# Patient Record
Sex: Female | Born: 1990 | Race: White | Hispanic: No | Marital: Single | Smoking: Never smoker
Health system: Southern US, Community
[De-identification: ages and names within clinical notes are randomized; demographics above are authoritative.]

## PROBLEM LIST (undated history)

## (undated) DIAGNOSIS — N6009 Solitary cyst of unspecified breast: Secondary | ICD-10-CM

## (undated) DIAGNOSIS — R102 Pelvic and perineal pain: Secondary | ICD-10-CM

## (undated) DIAGNOSIS — N920 Excessive and frequent menstruation with regular cycle: Secondary | ICD-10-CM

## (undated) DIAGNOSIS — N946 Dysmenorrhea, unspecified: Secondary | ICD-10-CM

## (undated) DIAGNOSIS — F419 Anxiety disorder, unspecified: Secondary | ICD-10-CM

## (undated) HISTORY — PX: NO PAST SURGERIES: SHX2092

## (undated) HISTORY — DX: Pelvic and perineal pain: R10.2

## (undated) HISTORY — DX: Excessive and frequent menstruation with regular cycle: N92.0

## (undated) HISTORY — DX: Dysmenorrhea, unspecified: N94.6

## (undated) HISTORY — DX: Solitary cyst of unspecified breast: N60.09

## (undated) HISTORY — DX: Anxiety disorder, unspecified: F41.9

---

## 2014-05-30 ENCOUNTER — Ambulatory Visit: Payer: Self-pay | Admitting: Obstetrics and Gynecology

## 2015-10-04 LAB — OB RESULTS CONSOLE GC/CHLAMYDIA: CHLAMYDIA, DNA PROBE: NEGATIVE

## 2015-11-02 ENCOUNTER — Ambulatory Visit (INDEPENDENT_AMBULATORY_CARE_PROVIDER_SITE_OTHER): Payer: BC Managed Care – PPO | Admitting: Certified Nurse Midwife

## 2015-11-02 ENCOUNTER — Encounter: Payer: Self-pay | Admitting: Certified Nurse Midwife

## 2015-11-02 VITALS — BP 132/94 | HR 71 | Ht 65.0 in | Wt 179.0 lb

## 2015-11-02 DIAGNOSIS — R102 Pelvic and perineal pain: Secondary | ICD-10-CM

## 2015-11-02 DIAGNOSIS — R109 Unspecified abdominal pain: Secondary | ICD-10-CM

## 2015-11-02 DIAGNOSIS — Z202 Contact with and (suspected) exposure to infections with a predominantly sexual mode of transmission: Secondary | ICD-10-CM

## 2015-11-02 LAB — POCT URINALYSIS DIPSTICK
Bilirubin, UA: NEGATIVE
Blood, UA: NEGATIVE
Glucose, UA: NEGATIVE
KETONES UA: NEGATIVE
LEUKOCYTES UA: NEGATIVE
NITRITE UA: NEGATIVE
PROTEIN UA: NEGATIVE
Spec Grav, UA: 1.01
Urobilinogen, UA: NEGATIVE
pH, UA: 6.5

## 2015-11-02 LAB — POCT URINE PREGNANCY: Preg Test, Ur: NEGATIVE

## 2015-11-02 MED ORDER — FLUCONAZOLE 150 MG PO TABS: 150.0000 mg | ORAL_TABLET | Freq: Every day | ORAL | Status: DC

## 2015-11-02 NOTE — Progress Notes (Signed)
Subjective:     Patient ID: Lindsay Ellison, female   DOB: 11/28/1990, 24 y.o.   MRN: [id number]  HPI  Patient complains of cramping that comes & goes for over a year.  She is on an OCP.  She has regular cycles. She rates pain 5 to 8 when occurs.  Currently pain is a 3.  She takes Advil 400 mg PRN and uses a heating pad that helps. No aggravating factors. She has had a new sexual partner.  She has had 5 life time partners.  Her pap is up to date and normal. 11/2014.  She has never had a pelvic surgery or STI.   Review of Systems  Constitutional: Negative for activity change and appetite change.  Gastrointestinal: Negative for abdominal pain and abdominal distention.  Endocrine: Negative for cold intolerance and heat intolerance.  Genitourinary: Positive for pelvic pain. Negative for dysuria, hematuria, flank pain, genital sores and menstrual problem.       Objective:   Physical Exam  Constitutional: She appears well-developed and well-nourished.  Pulmonary/Chest: Effort normal.  Abdominal: Soft. She exhibits no distension. There is no tenderness. There is no CVA tenderness. Hernia confirmed negative in the right inguinal area and confirmed negative in the left inguinal area.  Genitourinary: There is no rash or tenderness on the right labia. There is no rash or tenderness on the left labia. Uterus is tender. Uterus is not enlarged. Cervix exhibits discharge. Cervix exhibits no motion tenderness. Right adnexum displays tenderness. Right adnexum displays no mass. Left adnexum displays tenderness. Left adnexum displays no mass. No erythema in the vagina. No signs of injury around the vagina. Vaginal discharge found.  Vaginal area shaved.  Small amount of mucous blood tinged discharge GC & CZ swap completed.  Wet mount completed negative clue, amine, tric, +Hyphae & budding yeast.  Lymphadenopathy:       Right: No inguinal adenopathy present.       Left: No inguinal adenopathy present.  Nursing  note and vitals reviewed.      Assessment:     Vaginal yeast Pelvic pain     Plan:     Urine analysis Wet mount Pregnancy test Pelvic sonogram GC & CZ culture  STI panel

## 2015-11-02 NOTE — Patient Instructions (Signed)

## 2015-11-05 LAB — GC/CHLAMYDIA PROBE AMP
Chlamydia trachomatis, NAA: NEGATIVE
NEISSERIA GONORRHOEAE BY PCR: NEGATIVE

## 2015-11-10 ENCOUNTER — Encounter: Payer: Self-pay | Admitting: Certified Nurse Midwife

## 2015-11-10 ENCOUNTER — Ambulatory Visit (INDEPENDENT_AMBULATORY_CARE_PROVIDER_SITE_OTHER): Payer: BC Managed Care – PPO

## 2015-11-10 ENCOUNTER — Other Ambulatory Visit: Payer: Self-pay

## 2015-11-10 ENCOUNTER — Ambulatory Visit (INDEPENDENT_AMBULATORY_CARE_PROVIDER_SITE_OTHER): Payer: BC Managed Care – PPO | Admitting: Certified Nurse Midwife

## 2015-11-10 VITALS — BP 118/80 | HR 92 | Ht 65.0 in | Wt 181.4 lb

## 2015-11-10 DIAGNOSIS — N83209 Unspecified ovarian cyst, unspecified side: Secondary | ICD-10-CM | POA: Insufficient documentation

## 2015-11-10 DIAGNOSIS — N83201 Unspecified ovarian cyst, right side: Secondary | ICD-10-CM

## 2015-11-10 DIAGNOSIS — R102 Pelvic and perineal pain: Secondary | ICD-10-CM

## 2015-11-10 DIAGNOSIS — Z202 Contact with and (suspected) exposure to infections with a predominantly sexual mode of transmission: Secondary | ICD-10-CM

## 2015-11-10 NOTE — Progress Notes (Signed)
Subjective:     Patient ID: Lindsay ModestKatelyn Nicole Ellison, female   DOB: 09/27/1991, 24 y.o.   MRN: [id number]  HPI Patient here for sonogram and follow up on GC & CZ results and to do a complete STI panel due to new sexual partner.  SHe is currently taking an OCP and her menses is due 11/16/15.  She states she has pelvic pain on the right side more than left today that she rates a 3.  She takes motrin 200 mg which helps.  It is aggravated by sitting for long periods during the day.   Review of Systems  Respiratory: Negative for cough.   Cardiovascular: Negative for chest pain.  Genitourinary: Positive for pelvic pain. Negative for flank pain, difficulty urinating, menstrual problem and dyspareunia.       Rates pelvic pain a 3 today       Objective:   Physical Exam  Constitutional: She is oriented to person, place, and time. She appears well-developed and well-nourished.  Pulmonary/Chest: Effort normal.  Neurological: She is alert and oriented to person, place, and time.  Skin: Skin is warm and dry.  Psychiatric: She has a normal mood and affect. Her behavior is normal. Judgment and thought content normal.  Nursing note and vitals reviewed.   Sonogram report Indications:Pelvic Pain Findings:  The uterus measures 7.3 x 3.2 x 4.5. Echo texture is homogenous and heterogenous without evidence of focal masses. The Endometrium measures 7.7 mm.  Right Ovary measures 2.9 x 2.1 x 2.1 cm. Bilobed cyst on right ovary measures 2.2 x 1.8 x 1.3cm. Left Ovary measures 2.6 x 1.6 x 2.5 cm. It is normal appearance. Survey of the adnexa demonstrates no adnexal masses. There is no free fluid in the cul de sac.  Impression: 1. Bilobed cyst on right ovary measures 2.2 x 1.8 x 1.3cm.  Recommendations: 1.Clinical correlation with the patient's History and Physical Exam.    Assessment:     Right cyst     Plan:     Continue OCP as scheduled Motrin 600 mg PRN pain every 6 hours STI panel today will call  if any abnormal results Limit sexual partners Use condoms  HIV every 3 months for 1 year with each new sexual partner A total of 15 minutes were spent face-to-face with the patient during this encounter and over half of that time dealt with counseling and coordination of care.

## 2015-11-10 NOTE — Patient Instructions (Signed)
Motrin PRN pain

## 2015-11-11 ENCOUNTER — Encounter: Payer: Self-pay | Admitting: Certified Nurse Midwife

## 2015-11-11 ENCOUNTER — Telehealth: Payer: Self-pay | Admitting: Certified Nurse Midwife

## 2015-11-11 LAB — RPR: RPR Ser Ql: NONREACTIVE

## 2015-11-11 LAB — HEPATITIS C ANTIBODY: Hep C Virus Ab: <0.1 s/co ratio (ref 0.0–0.9)

## 2015-11-11 LAB — HIV ANTIBODY (ROUTINE TESTING W REFLEX): HIV Screen 4th Generation wRfx: NONREACTIVE

## 2015-11-11 LAB — HEPATITIS B SURFACE ANTIGEN: HEP B S AG: NEGATIVE

## 2015-11-11 LAB — HSV(HERPES SIMPLEX VRS) I + II AB-IGG
HSV 1 GLYCOPROTEIN G AB, IGG: 25.4 {index} — AB (ref 0.00–0.90)
HSV 2 Glycoprotein G Ab, IgG: <0.91 index (ref 0.00–0.90)

## 2015-11-11 NOTE — Telephone Encounter (Signed)
Called patient and left message to return call.  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  No follow up needed.

## 2015-11-18 ENCOUNTER — Telehealth: Payer: Self-pay

## 2015-11-18 NOTE — Telephone Encounter (Signed)
Left message for pt to contact office for lab results. (2nd attempt)

## 2015-11-18 NOTE — Telephone Encounter (Signed)
Pt aware of HSV I positive.

## 2016-04-19 ENCOUNTER — Encounter: Payer: Self-pay | Admitting: Gynecology

## 2016-04-19 ENCOUNTER — Ambulatory Visit (INDEPENDENT_AMBULATORY_CARE_PROVIDER_SITE_OTHER): Payer: BC Managed Care – PPO

## 2016-04-19 ENCOUNTER — Ambulatory Visit
Admission: EM | Admit: 2016-04-19 | Discharge: 2016-04-19 | Disposition: A | Discharge status: Home / Self Care | Payer: BC Managed Care – PPO | Attending: Family Medicine | Admitting: Family Medicine

## 2016-04-19 DIAGNOSIS — S93401A Sprain of unspecified ligament of right ankle, initial encounter: Secondary | ICD-10-CM | POA: Diagnosis not present

## 2016-04-19 NOTE — ED Provider Notes (Signed)
CSN: [id number]     Arrival date & time 04/19/16  1058 History   First MD Initiated Contact with Patient 04/19/16 1214     Chief Complaint  Patient presents with  . Foot Injury   (Consider location/radiation/quality/duration/timing/severity/associated sxs/prior Treatment) HPI   This a 25 year old female who is presents with right lateral foot pain. She states that she was at a cycling class last night onto the cycling bike and twisted her foot into inversion. She was able to ride for 1 song and realized that she was having so much pain she wasn't breathing properly. She is able to walk only on her heel because the lateral foot hurts so much when she steps on it. Noticed a swelling laterally over the lateral malleolus and has pain along the fifth metatarsal from the base to the metatarsal head. There is mild swelling but no ecchymosis present.     Past Medical History  Diagnosis Date  . Anxiety   . Painful menstrual periods   . Breast cyst     right, diffuse fibrocystic (history)   . Heavy periods   . Pelvic pain in female    History reviewed. No pertinent past surgical history. Family History  Problem Relation Age of Onset  . Heart disease Paternal Grandfather   . Pancreatitis Father   . Hypertension Paternal Grandfather   . Stroke Paternal Grandfather   . CAD Paternal Uncle   . Cancer Paternal Grandmother     pancreas vs liver-not sure   Social History  Substance Use Topics  . Smoking status: Never Smoker   . Smokeless tobacco: Never Used  . Alcohol Use: 0.0 oz/week    0 Standard drinks or equivalent per week   OB History    Gravida Para Term Preterm AB TAB SAB Ectopic Multiple Living   0 0 0 0 0 0 0 0 0 0      Review of Systems  Constitutional: Positive for activity change. Negative for fever, chills and fatigue.  Musculoskeletal: Positive for joint swelling and gait problem.  All other systems reviewed and are negative.   Allergies  Review of patient's allergies  indicates no known allergies.  Home Medications   Prior to Admission medications   Medication Sig Start Date End Date Taking? Authorizing Provider  norgestimate-ethinyl estradiol (ORTHO-CYCLEN,SPRINTEC,PREVIFEM) 0.25-35 MG-MCG tablet Take by mouth.   Yes Historical Provider, MD   Meds Ordered and Administered this Visit  Medications - No data to display  BP 123/86 mmHg  Pulse 85  Temp(Src) 98.2 F (36.8 C) (Oral)  Resp 16  Ht 5\' 5"  (1.651 m)  Wt 175 lb (79.379 kg)  BMI 29.12 kg/m2  SpO2 98%  LMP 04/07/2016 No data found.   Physical Exam  Constitutional: She is oriented to person, place, and time. She appears well-developed and well-nourished. No distress.  HENT:  Head: Normocephalic and atraumatic.  Eyes: Conjunctivae are normal. Pupils are equal, round, and reactive to light.  Neck: Normal range of motion. Neck supple.  Musculoskeletal:  Examination of the right foot shows some mild decreased range of motion to flexion and extension. Subtalar motion is intact. There is mild swelling over the lateral malleolus. Tenderness is over the inferior portion of the lateral malleolus extending to the base of the fifth metatarsal. She also has tenderness along the fifth metatarsal and especially plantarly distally at the metatarsal head. No ecchymosis is seen there is only mild swelling present.  Neurological: She is alert and oriented to person, place, and  time.  Skin: Skin is warm and dry. She is not diaphoretic.  Psychiatric: She has a normal mood and affect. Her behavior is normal. Judgment and thought content normal.  Nursing note and vitals reviewed.   ED Course  Procedures (including critical care time)  Labs Review Labs Reviewed - No data to display  Imaging Review Dg Foot Complete Right  04/19/2016  CLINICAL DATA:  Larey Seat last night with pain laterally EXAM: RIGHT FOOT COMPLETE - 3+ VIEW COMPARISON:  None. FINDINGS: Tarsal-metatarsal alignment is normal. No fracture is seen.  Joint spaces appear normal. No significant soft tissue swelling is noted. IMPRESSION: Negative. Electronically Signed   By: Dwyane Dee M.D.   On: 04/19/2016 12:50     Visual Acuity Review  Right Eye Distance:   Left Eye Distance:   Bilateral Distance:    Right Eye Near:   Left Eye Near:    Bilateral Near:      She was given a boot orthosis for ambulation   MDM   1. Sprain of ligament of ankle, right, initial encounter    Discharge Medication List as of 04/19/2016  1:39 PM    Plan: 1. Test/x-ray results and diagnosis reviewed with patient 2. rx as per orders; risks, benefits, potential side effects reviewed with patient 3. Recommend supportive treatment with Ice and elevation to control swelling and pain. Motrin for pain as necessary. Patient is going on a cruise to the [place] and would be called to use crutches so I have decided to provide her with a boot to allow her better mobility. I told her that she needs this for comfort and may come out of the boot for rest times and for personal care. She continues to have problems she may return here or go to an orthopedic surgeon for further evaluation and care 4. F/u prn if symptoms worsen or don't improve     Lutricia Feil, PA-C 04/19/16 1509

## 2016-04-19 NOTE — Discharge Instructions (Signed)
Ankle Sprain  An ankle sprain is an injury to the strong, fibrous tissues (ligaments) that hold the bones of your ankle joint together.   CAUSES  An ankle sprain is usually caused by a fall or by twisting your ankle. Ankle sprains most commonly occur when you step on the outer edge of your foot, and your ankle turns inward. People who participate in sports are more prone to these types of injuries.   SYMPTOMS    Pain in your ankle. The pain may be present at rest or only when you are trying to stand or walk.   Swelling.   Bruising. Bruising may develop immediately or within 1 to 2 days after your injury.   Difficulty standing or walking, particularly when turning corners or changing directions.  DIAGNOSIS   Your caregiver will ask you details about your injury and perform a physical exam of your ankle to determine if you have an ankle sprain. During the physical exam, your caregiver will press on and apply pressure to specific areas of your foot and ankle. Your caregiver will try to move your ankle in certain ways. An X-ray exam may be done to be sure a bone was not broken or a ligament did not separate from one of the bones in your ankle (avulsion fracture).   TREATMENT   Certain types of braces can help stabilize your ankle. Your caregiver can make a recommendation for this. Your caregiver may recommend the use of medicine for pain. If your sprain is severe, your caregiver may refer you to a surgeon who helps to restore function to parts of your skeletal system (orthopedist) or a physical therapist.  HOME CARE INSTRUCTIONS    Apply ice to your injury for 1-2 days or as directed by your caregiver. Applying ice helps to reduce inflammation and pain.    Put ice in a plastic bag.    Place a towel between your skin and the bag.    Leave the ice on for 15-20 minutes at a time, every 2 hours while you are awake.   Only take over-the-counter or prescription medicines for pain, discomfort, or fever as directed by  your caregiver.   Elevate your injured ankle above the level of your heart as much as possible for 2-3 days.   If your caregiver recommends crutches, use them as instructed. Gradually put weight on the affected ankle. Continue to use crutches or a cane until you can walk without feeling pain in your ankle.   If you have a plaster splint, wear the splint as directed by your caregiver. Do not rest it on anything harder than a pillow for the first 24 hours. Do not put weight on it. Do not get it wet. You may take it off to take a shower or bath.   You may have been given an elastic bandage to wear around your ankle to provide support. If the elastic bandage is too tight (you have numbness or tingling in your foot or your foot becomes cold and blue), adjust the bandage to make it comfortable.   If you have an air splint, you may blow more air into it or let air out to make it more comfortable. You may take your splint off at night and before taking a shower or bath. Wiggle your toes in the splint several times per day to decrease swelling.  SEEK MEDICAL CARE IF:    You have rapidly increasing bruising or swelling.   Your toes feel   extremely cold or you lose feeling in your foot.   Your pain is not relieved with medicine.  SEEK IMMEDIATE MEDICAL CARE IF:   Your toes are numb or blue.   You have severe pain that is increasing.  MAKE SURE YOU:    Understand these instructions.   Will watch your condition.   Will get help right away if you are not doing well or get worse.     This information is not intended to replace advice given to you by your health care provider. Make sure you discuss any questions you have with your health care provider.     Document Released: 10/31/2005 Document Revised: 11/21/2014 Document Reviewed: 11/12/2011  Elsevier Interactive Patient Education 2016 [employer]

## 2016-04-19 NOTE — ED Notes (Signed)
Patient c/o while at a cycling class last pm injury right foot while stepping up on the cycling bike. Per patient painful to walk.

## 2017-04-13 IMAGING — CR DG FOOT COMPLETE 3+V*R*
3 series · 3 of 3 positions shown · non-contrast
Comparison: None.

CLINICAL DATA: Fell last night with pain laterally

EXAM:
RIGHT FOOT COMPLETE - 3+ VIEW

[foot ap]
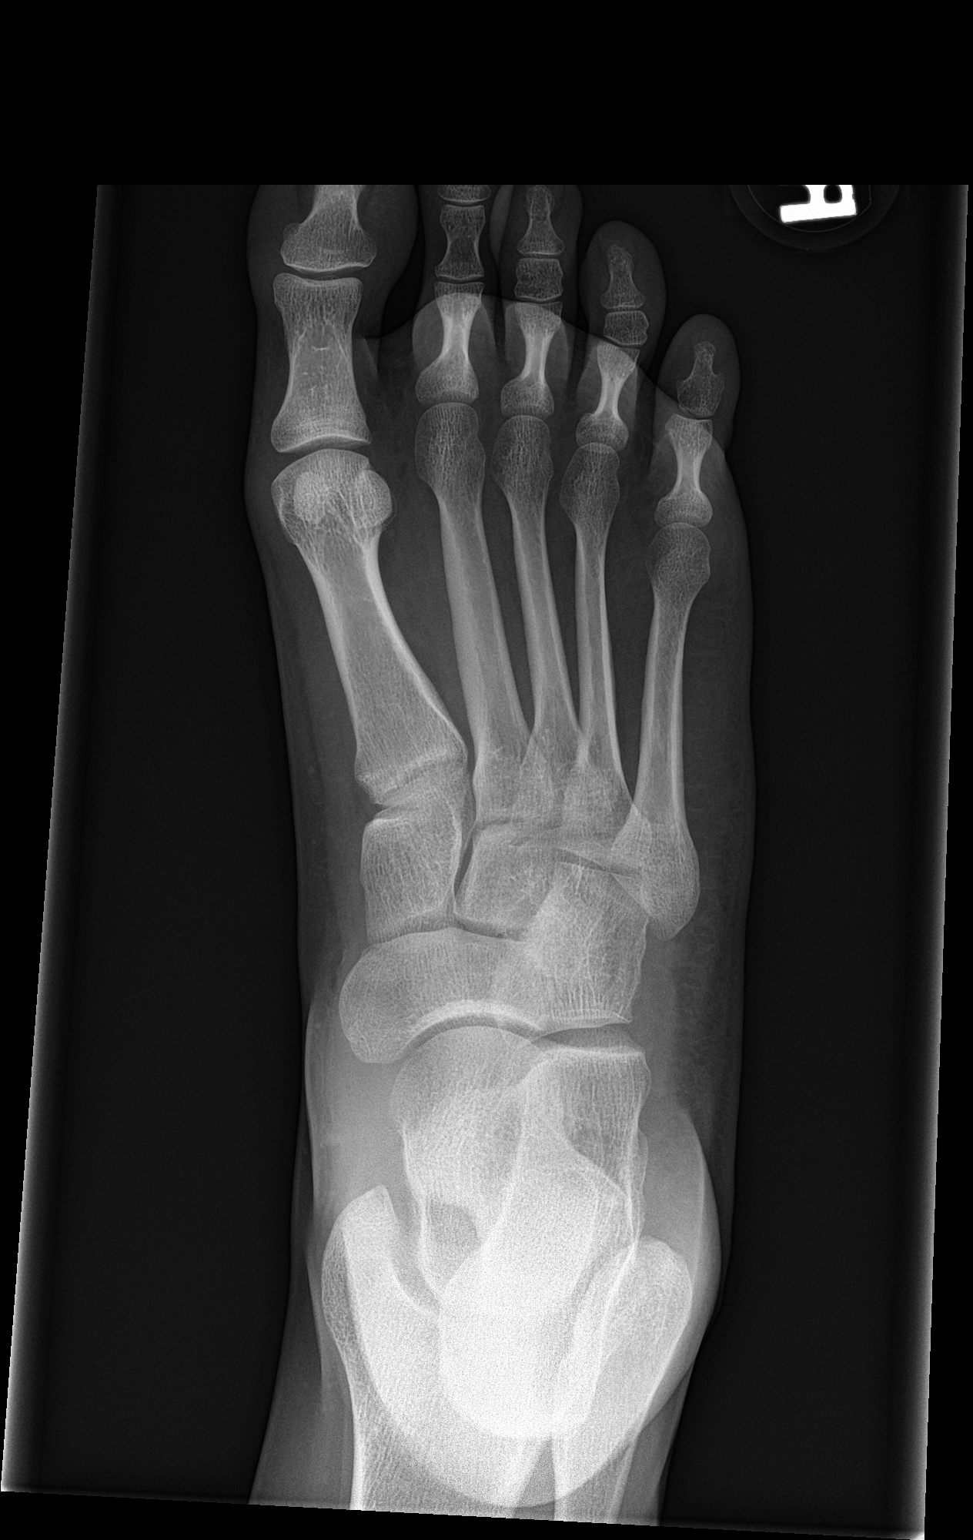

[foot obl]
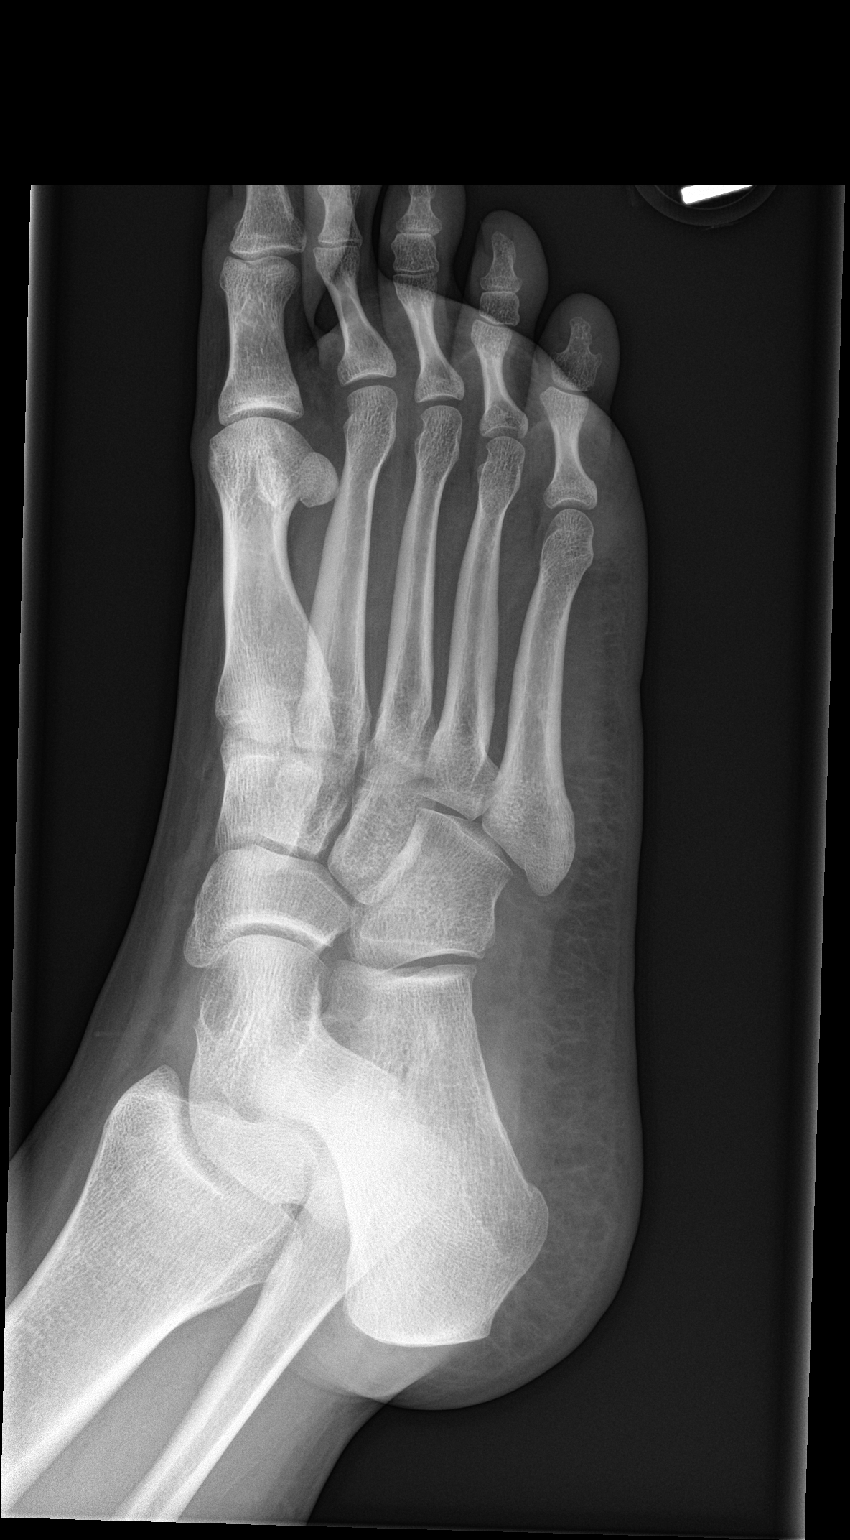

[foot lat]
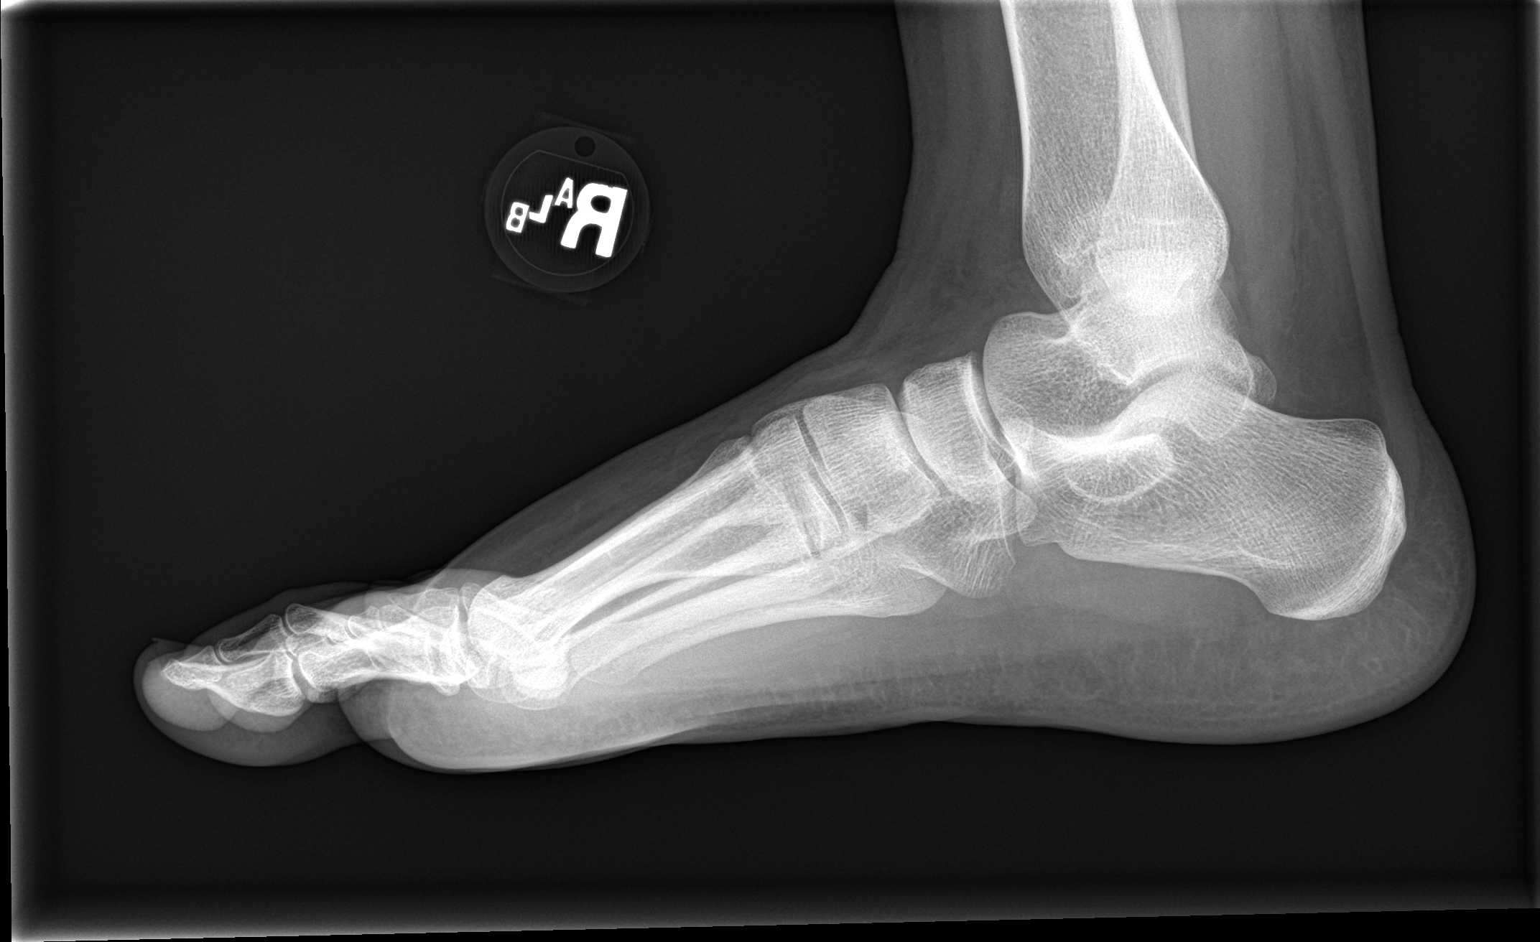

[3 of 3 positions shown; findings below may reference images not displayed]

FINDINGS: Tarsal-metatarsal alignment is normal. No fracture is seen. Joint
spaces appear normal. No significant soft tissue swelling is noted.
IMPRESSION: Negative.

## 2017-12-04 ENCOUNTER — Encounter: Payer: Self-pay | Admitting: Obstetrics and Gynecology

## 2017-12-04 ENCOUNTER — Ambulatory Visit (INDEPENDENT_AMBULATORY_CARE_PROVIDER_SITE_OTHER): Payer: BC Managed Care – PPO | Admitting: Obstetrics and Gynecology

## 2017-12-04 VITALS — BP 120/80 | HR 77 | Ht 65.0 in | Wt 170.0 lb

## 2017-12-04 DIAGNOSIS — N921 Excessive and frequent menstruation with irregular cycle: Secondary | ICD-10-CM | POA: Diagnosis not present

## 2017-12-04 DIAGNOSIS — Z113 Encounter for screening for infections with a predominantly sexual mode of transmission: Secondary | ICD-10-CM | POA: Diagnosis not present

## 2017-12-04 NOTE — Progress Notes (Signed)
Chief Complaint  Patient presents with  . Metrorrhagia    HPI:      [name] is a 27 y.o. G0P0000 who LMP was Patient's last menstrual period was 11/23/2017., presents today for irregular bleeding on OCPs for the past 10 days. She is on OTC and usually has monthly menses without BTB. She has had dark spotting with dysmen and nickel-sized clots, without any late/missed OCPs. She had a neg UPT 2 days ago. She is sex active, no new partners. No condom use. No vag sx.   Last annual 4/18. Hx of menometrorrhagia, improved with OCPs.  Past Medical History:  Diagnosis Date  . Anxiety   . Breast cyst    right, diffuse fibrocystic (history)   . Heavy periods   . Painful menstrual periods   . Pelvic pain in female     History reviewed. No pertinent surgical history.  Family History  Problem Relation Age of Onset  . Pancreatitis Father   . Heart disease Paternal Grandfather   . Hypertension Paternal Grandfather   . Stroke Paternal Grandfather   . Cancer Paternal Grandmother        pancreas vs liver-not sure  . CAD Paternal Uncle     Social History   Socioeconomic History  . Marital status: Single    Spouse name: Not on file  . Number of children: Not on file  . Years of education: Not on file  . Highest education level: Not on file  Social Needs  . Financial resource strain: Not on file  . Food insecurity - worry: Not on file  . Food insecurity - inability: Not on file  . Transportation needs - medical: Not on file  . Transportation needs - non-medical: Not on file  Occupational History  . Occupation: Technical brewer: [employer]  Tobacco Use  . Smoking status: Never Smoker  . Smokeless tobacco: Never Used  Substance and Sexual Activity  . Alcohol use: Yes    Alcohol/week: 0.0 oz  . Drug use: No  . Sexual activity: Yes    Partners: Male  Other Topics Concern  . Not on file  Social History Narrative  . Not on file     Current Outpatient  Medications:  .  norgestimate-ethinyl estradiol (ORTHO-CYCLEN,SPRINTEC,PREVIFEM) 0.25-35 MG-MCG tablet, Take by mouth., Disp: , Rfl:    ROS:  Review of Systems  Constitutional: Negative for fever.  Gastrointestinal: Negative for blood in stool, constipation, diarrhea, nausea and vomiting.  Genitourinary: Positive for menstrual problem. Negative for dyspareunia, dysuria, flank pain, frequency, hematuria, urgency, vaginal bleeding, vaginal discharge and vaginal pain.  Musculoskeletal: Negative for back pain.  Skin: Negative for rash.     OBJECTIVE:   Vitals:  BP 120/80   Pulse 77   Ht 5\' 5"  (1.651 m)   Wt 170 lb (77.1 kg)   LMP 11/23/2017   BMI 28.29 kg/m   Physical Exam  Constitutional: She is oriented to person, place, and time and well-developed, well-nourished, and in no distress. Vital signs are normal.  Genitourinary: Vagina normal, uterus normal, cervix normal, right adnexa normal, left adnexa normal and vulva normal. Uterus is not enlarged. Cervix exhibits no motion tenderness and no tenderness. Right adnexum displays no mass and no tenderness. Left adnexum displays no mass and no tenderness. Vulva exhibits no erythema, no exudate, no lesion, no rash and no tenderness. Vagina exhibits no lesion.  Neurological: She is oriented to person, place, and time.  Psychiatric: Mood,  memory, affect and judgment normal.  Vitals reviewed.   Assessment/Plan: Breakthrough bleeding on birth control pills - Neg home UPT 2 days ago. Check nuswab today. Will f/u with results. If neg, cont OCPs and see if sx persist on next pill pack. F/u prn.   Screening for STD (sexually transmitted disease) - Plan: Chlamydia/Gonococcus/Trichomonas, NAA    Return if symptoms worsen or fail to improve.  Alicia B. Copland, PA-C 12/04/2017 11:26 AM

## 2017-12-04 NOTE — Patient Instructions (Signed)
I value your feedback and entrusting us with your care. If you get a [facility] patient survey, I would appreciate you taking the time to let us know about your experience today. Thank you! 

## 2017-12-06 LAB — CHLAMYDIA/GONOCOCCUS/TRICHOMONAS, NAA
CHLAMYDIA BY NAA: NEGATIVE
Gonococcus by NAA: NEGATIVE
Trich vag by NAA: NEGATIVE

## 2018-10-08 ENCOUNTER — Encounter: Payer: Self-pay | Admitting: Emergency Medicine

## 2018-10-08 ENCOUNTER — Ambulatory Visit
Admission: EM | Admit: 2018-10-08 | Discharge: 2018-10-08 | Disposition: A | Discharge status: Home / Self Care | Payer: BC Managed Care – PPO | Attending: Emergency Medicine | Admitting: Emergency Medicine

## 2018-10-08 ENCOUNTER — Other Ambulatory Visit: Payer: Self-pay

## 2018-10-08 DIAGNOSIS — J029 Acute pharyngitis, unspecified: Secondary | ICD-10-CM | POA: Diagnosis not present

## 2018-10-08 LAB — RAPID STREP SCREEN (MED CTR MEBANE ONLY): Streptococcus, Group A Screen (Direct): NEGATIVE

## 2018-10-08 MED ORDER — IBUPROFEN 600 MG PO TABS: 600.0000 mg | ORAL_TABLET | Freq: Four times a day (QID) | ORAL | 0 refills | Status: DC | PRN

## 2018-10-08 NOTE — ED Provider Notes (Signed)
HPI  SUBJECTIVE:  Patient reports sore throat starting 2 days ago. Sx worse with swallowing , and at night.  Sx better with nothing. Has been taking mucinex w/ o relief.  No fever   No neck stiffness  No Cough, rhinorrhea, postnasal drip.  She reports nasal congestion starting this morning No Myalgias + Headache No Rash     No Recent Strep or mono exposure No Abdominal Pain No reflux sxs No Allergy sxs  No Breathing difficulty, voice changes, sensation of throat swelling shut No Drooling No Trismus No abx in past month.  No antipyretic in past 4-6 hrs Past Medical history of frequent strep as a child.  No history of mono, diabetes, hypertension. LMP 1 month ago.  Denies possibility of being pregnant.   WUJ:[id number]:Santayana, Lindsay HalstedGloria Patricia, DO    Past Medical History:  Diagnosis Date  . Anxiety   . Breast cyst    right, diffuse fibrocystic (history)   . Heavy periods   . Painful menstrual periods   . Pelvic pain in female     Past Surgical History:  Procedure Laterality Date  . NO PAST SURGERIES      Family History  Problem Relation Age of Onset  . Pancreatitis Father   . Heart disease Paternal Grandfather   . Hypertension Paternal Grandfather   . Stroke Paternal Grandfather   . Cancer Paternal Grandmother        pancreas vs liver-not sure  . CAD Paternal Uncle     Social History   Tobacco Use  . Smoking status: Never Smoker  . Smokeless tobacco: Never Used  Substance Use Topics  . Alcohol use: Yes    Alcohol/week: 0.0 standard drinks    Comment: occasionally  . Drug use: No    No current facility-administered medications for this encounter.   Current Outpatient Medications:  .  norgestimate-ethinyl estradiol (ORTHO-CYCLEN,SPRINTEC,PREVIFEM) 0.25-35 MG-MCG tablet, Take by mouth., Disp: , Rfl:  .  ibuprofen (ADVIL,MOTRIN) 600 MG tablet, Take 1 tablet (600 mg total) by mouth every 6 (six) hours as needed., Disp: 30 tablet, Rfl: 0  No Known  Allergies   ROS  As noted in HPI.   Physical Exam  BP (!) 125/94 (BP Location: Left Arm)   Pulse 88   Temp 98.1 F (36.7 C) (Oral)   Resp 16   Ht 5\' 5"  (1.651 m)   Wt 72.1 kg   LMP 09/07/2018   SpO2 99%   BMI 26.46 kg/m   Constitutional: Well developed, well nourished, no acute distress Eyes:  EOMI, conjunctiva normal bilaterally HENT: Normocephalic, atraumatic,mucus membranes moist. - nasal congestion +erythematous oropharynx  - enlarged tonsils - exudates. Uvula midline.  Questionable ulcers of the posterior oropharynx. Respiratory: Normal inspiratory effort Cardiovascular: Normal rate, no murmurs, rubs, gallops GI: nondistended, nontender. No appreciable splenomegaly skin: No rash, skin intact Lymph: + Anterior cervical LN.  No posterior cervical lymphadenopathy Musculoskeletal: no deformities Neurologic: Alert & oriented x 3, no focal neuro deficits Psychiatric: Speech and behavior appropriate.  ED Course   Medications - No data to display  Orders Placed This Encounter  Procedures  . Rapid Strep Screen ([facility] ONLY)    Standing Status:   Standing    Number of Occurrences:   1  . Culture, group A strep    Standing Status:   Standing    Number of Occurrences:   1    Results for orders placed or performed during the hospital encounter of 10/08/18 (from the  past 24 hour(s))  Rapid Strep Screen ([facility] ONLY)     Status: None   Collection Time: 10/08/18  4:54 PM  Result Value Ref Range   Streptococcus, Group A Screen (Direct) NEGATIVE NEGATIVE   No results found.  ED Clinical Impression  Viral pharyngitis   ED Assessment/Plan  Rapid strep negative. Obtaining throat culture to guide antibiotic treatment. Discussed this with patient. We'll contact them if culture is positive, and will call in Appropriate antibiotics.  Otherwise, treating as a viral pharyngitis.  Patient home with ibuprofen, Tylenol, Benadryl/Maalox mixture.. Patient to  followup with PMD when necessary.  Discussed labs,  MDM, plan and followup with patient. Discussed sn/sx that should prompt return to the ED. patient agrees with plan.   Meds ordered this encounter  Medications  . ibuprofen (ADVIL,MOTRIN) 600 MG tablet    Sig: Take 1 tablet (600 mg total) by mouth every 6 (six) hours as needed.    Dispense:  30 tablet    Refill:  0     *This clinic note was created using Scientist, clinical (histocompatibility and immunogenetics). Therefore, there may be occasional mistakes despite careful proofreading.    Domenick Gong, MD 10/08/18 1818

## 2018-10-08 NOTE — ED Triage Notes (Signed)
Pt c/o sore throat, and headache. Started 2 days ago and getting worse.

## 2018-10-08 NOTE — Discharge Instructions (Addendum)
your rapid strep was negative today, so we have sent off a throat culture.  We will contact you and call in the appropriate antibiotics if your culture comes back positive for an infection requiring antibiotic treatment.  Give us a working phone number. 1 gram of Tylenol and 600 mg ibuprofen together 3-4 times a day as needed for pain.  Make sure you drink plenty of extra fluids.  Some people find salt water gargles and  Traditional Medicinal's "Throat Coat" tea helpful. Take 5 mL of liquid Benadryl and 5 mL of Maalox. Mix it together, and then hold it in your mouth for as long as you can and then swallow. You may do this 4 times a day.   ° °Go to www.goodrx.com to look up your medications. This will give you a list of where you can find your prescriptions at the most affordable prices. Or ask the pharmacist what the cash price is, or if they have any other discount programs available to help make your medication more affordable. This can be less expensive than what you would pay with insurance.   °

## 2018-10-11 LAB — CULTURE, GROUP A STREP (THRC)

## 2020-02-18 ENCOUNTER — Ambulatory Visit (INDEPENDENT_AMBULATORY_CARE_PROVIDER_SITE_OTHER): Payer: BC Managed Care – PPO | Admitting: Advanced Practice Midwife

## 2020-02-18 ENCOUNTER — Other Ambulatory Visit: Payer: Self-pay

## 2020-02-18 ENCOUNTER — Encounter: Payer: Self-pay | Admitting: Advanced Practice Midwife

## 2020-02-18 ENCOUNTER — Other Ambulatory Visit (HOSPITAL_COMMUNITY)
Admission: RE | Admit: 2020-02-18 | Discharge: 2020-02-18 | Disposition: A | Discharge status: Home / Self Care | Payer: BC Managed Care – PPO | Source: Ambulatory Visit | Attending: Advanced Practice Midwife | Admitting: Advanced Practice Midwife

## 2020-02-18 VITALS — BP 118/74 | Wt 173.0 lb

## 2020-02-18 DIAGNOSIS — Z113 Encounter for screening for infections with a predominantly sexual mode of transmission: Secondary | ICD-10-CM | POA: Insufficient documentation

## 2020-02-18 DIAGNOSIS — Z3A01 Less than 8 weeks gestation of pregnancy: Secondary | ICD-10-CM

## 2020-02-18 DIAGNOSIS — Z124 Encounter for screening for malignant neoplasm of cervix: Secondary | ICD-10-CM | POA: Diagnosis present

## 2020-02-18 DIAGNOSIS — Z3400 Encounter for supervision of normal first pregnancy, unspecified trimester: Secondary | ICD-10-CM | POA: Diagnosis present

## 2020-02-18 DIAGNOSIS — Z3401 Encounter for supervision of normal first pregnancy, first trimester: Secondary | ICD-10-CM

## 2020-02-18 NOTE — Patient Instructions (Signed)
Breastfeeding  Choosing to breastfeed is one of the best decisions you can make for yourself and your baby. A change in hormones during pregnancy causes your breasts to make breast milk in your milk-producing glands. Hormones prevent breast milk from being released before your baby is born. They also prompt milk flow after birth. Once breastfeeding has begun, thoughts of your baby, as well as his or her sucking or crying, can stimulate the release of milk from your milk-producing glands. Benefits of breastfeeding Research shows that breastfeeding offers many health benefits for infants and mothers. It also offers a cost-free and convenient way to feed your baby. For your baby  Your first milk (colostrum) helps your baby's digestive system to function better.  Special cells in your milk (antibodies) help your baby to fight off infections.  Breastfed babies are less likely to develop asthma, allergies, obesity, or type 2 diabetes. They are also at lower risk for sudden infant death syndrome (SIDS).  Nutrients in breast milk are better able to meet your baby's needs compared to infant formula.  Breast milk improves your baby's brain development. For you  Breastfeeding helps to create a very special bond between you and your baby.  Breastfeeding is convenient. Breast milk costs nothing and is always available at the correct temperature.  Breastfeeding helps to burn calories. It helps you to lose the weight that you gained during pregnancy.  Breastfeeding makes your uterus return faster to its size before pregnancy. It also slows bleeding (lochia) after you give birth.  Breastfeeding helps to lower your risk of developing type 2 diabetes, osteoporosis, rheumatoid arthritis, cardiovascular disease, and breast, ovarian, uterine, and endometrial cancer later in life. Breastfeeding basics Starting breastfeeding  Find a comfortable place to sit or lie down, with your neck and back  well-supported.  Place a pillow or a rolled-up blanket under your baby to bring him or her to the level of your breast (if you are seated). Nursing pillows are specially designed to help support your arms and your baby while you breastfeed.  Make sure that your baby's tummy (abdomen) is facing your abdomen.  Gently massage your breast. With your fingertips, massage from the outer edges of your breast inward toward the nipple. This encourages milk flow. If your milk flows slowly, you may need to continue this action during the feeding.  Support your breast with 4 fingers underneath and your thumb above your nipple (make the letter "C" with your hand). Make sure your fingers are well away from your nipple and your baby's mouth.  Stroke your baby's lips gently with your finger or nipple.  When your baby's mouth is open wide enough, quickly bring your baby to your breast, placing your entire nipple and as much of the areola as possible into your baby's mouth. The areola is the colored area around your nipple. ? More areola should be visible above your baby's upper lip than below the lower lip. ? Your baby's lips should be opened and extended outward (flanged) to ensure an adequate, comfortable latch. ? Your baby's tongue should be between his or her lower gum and your breast.  Make sure that your baby's mouth is correctly positioned around your nipple (latched). Your baby's lips should create a seal on your breast and be turned out (everted).  It is common for your baby to suck about 2-3 minutes in order to start the flow of breast milk. Latching Teaching your baby how to latch onto your breast properly is  very important. An improper latch can cause nipple pain, decreased milk supply, and poor weight gain in your baby. Also, if your baby is not latched onto your nipple properly, he or she may swallow some air during feeding. This can make your baby fussy. Burping your baby when you switch breasts  during the feeding can help to get rid of the air. However, teaching your baby to latch on properly is still the best way to prevent fussiness from swallowing air while breastfeeding. Signs that your baby has successfully latched onto your nipple  Silent tugging or silent sucking, without causing you pain. Infant's lips should be extended outward (flanged).  Swallowing heard between every 3-4 sucks once your milk has started to flow (after your let-down milk reflex occurs).  Muscle movement above and in front of his or her ears while sucking. Signs that your baby has not successfully latched onto your nipple  Sucking sounds or smacking sounds from your baby while breastfeeding.  Nipple pain. If you think your baby has not latched on correctly, slip your finger into the corner of your baby's mouth to break the suction and place it between your baby's gums. Attempt to start breastfeeding again. Signs of successful breastfeeding Signs from your baby  Your baby will gradually decrease the number of sucks or will completely stop sucking.  Your baby will fall asleep.  Your baby's body will relax.  Your baby will retain a small amount of milk in his or her mouth.  Your baby will let go of your breast by himself or herself. Signs from you  Breasts that have increased in firmness, weight, and size 1-3 hours after feeding.  Breasts that are softer immediately after breastfeeding.  Increased milk volume, as well as a change in milk consistency and color by the fifth day of breastfeeding.  Nipples that are not sore, cracked, or bleeding. Signs that your baby is getting enough milk  Wetting at least 1-2 diapers during the first 24 hours after birth.  Wetting at least 5-6 diapers every 24 hours for the first week after birth. The urine should be clear or pale yellow by the age of 5 days.  Wetting 6-8 diapers every 24 hours as your baby continues to grow and develop.  At least 3 stools in  a 24-hour period by the age of 5 days. The stool should be soft and yellow.  At least 3 stools in a 24-hour period by the age of 7 days. The stool should be seedy and yellow.  No loss of weight greater than 10% of birth weight during the first 3 days of life.  Average weight gain of 4-7 oz (113-198 g) per week after the age of 4 days.  Consistent daily weight gain by the age of 5 days, without weight loss after the age of 2 weeks. After a feeding, your baby may spit up a small amount of milk. This is normal. Breastfeeding frequency and duration Frequent feeding will help you make more milk and can prevent sore nipples and extremely full breasts (breast engorgement). Breastfeed when you feel the need to reduce the fullness of your breasts or when your baby shows signs of hunger. This is called "breastfeeding on demand." Signs that your baby is hungry include:  Increased alertness, activity, or restlessness.  Movement of the head from side to side.  Opening of the mouth when the corner of the mouth or cheek is stroked (rooting).  Increased sucking sounds, smacking lips, cooing,   sighing, or squeaking.  Hand-to-mouth movements and sucking on fingers or hands.  Fussing or crying. Avoid introducing a pacifier to your baby in the first 4-6 weeks after your baby is born. After this time, you may choose to use a pacifier. Research has shown that pacifier use during the first year of a baby's life decreases the risk of sudden infant death syndrome (SIDS). Allow your baby to feed on each breast as long as he or she wants. When your baby unlatches or falls asleep while feeding from the first breast, offer the second breast. Because newborns are often sleepy in the first few weeks of life, you may need to awaken your baby to get him or her to feed. Breastfeeding times will vary from baby to baby. However, the following rules can serve as a guide to help you make sure that your baby is properly  fed:  Newborns (babies 41 weeks of age or younger) may breastfeed every 1-3 hours.  Newborns should not go without breastfeeding for longer than 3 hours during the day or 5 hours during the night.  You should breastfeed your baby a minimum of 8 times in a 24-hour period. Breast milk pumping     Pumping and storing breast milk allows you to make sure that your baby is exclusively fed your breast milk, even at times when you are unable to breastfeed. This is especially important if you go back to work while you are still breastfeeding, or if you are not able to be present during feedings. Your lactation consultant can help you find a method of pumping that works best for you and give you guidelines about how long it is safe to store breast milk. Caring for your breasts while you breastfeed Nipples can become dry, cracked, and sore while breastfeeding. The following recommendations can help keep your breasts moisturized and healthy:  Avoid using soap on your nipples.  Wear a supportive bra designed especially for nursing. Avoid wearing underwire-style bras or extremely tight bras (sports bras).  Air-dry your nipples for 3-4 minutes after each feeding.  Use only cotton bra pads to absorb leaked breast milk. Leaking of breast milk between feedings is normal.  Use lanolin on your nipples after breastfeeding. Lanolin helps to maintain your skin's normal moisture barrier. Pure lanolin is not harmful (not toxic) to your baby. You may also hand express a few drops of breast milk and gently massage that milk into your nipples and allow the milk to air-dry. In the first few weeks after giving birth, some women experience breast engorgement. Engorgement can make your breasts feel heavy, warm, and tender to the touch. Engorgement peaks within 3-5 days after you give birth. The following recommendations can help to ease engorgement:  Completely empty your breasts while breastfeeding or pumping. You may  want to start by applying warm, moist heat (in the shower or with warm, water-soaked hand towels) just before feeding or pumping. This increases circulation and helps the milk flow. If your baby does not completely empty your breasts while breastfeeding, pump any extra milk after he or she is finished.  Apply ice packs to your breasts immediately after breastfeeding or pumping, unless this is too uncomfortable for you. To do this: ? Put ice in a plastic bag. ? Place a towel between your skin and the bag. ? Leave the ice on for 20 minutes, 2-3 times a day.  Make sure that your baby is latched on and positioned properly while breastfeeding. If  engorgement persists after 48 hours of following these recommendations, contact your health care provider or a Advertising copywriter. Overall health care recommendations while breastfeeding  Eat 3 healthy meals and 3 snacks every day. Well-nourished mothers who are breastfeeding need an additional 450-500 calories a day. You can meet this requirement by increasing the amount of a balanced diet that you eat.  Drink enough water to keep your urine pale yellow or clear.  Rest often, relax, and continue to take your prenatal vitamins to prevent fatigue, stress, and low vitamin and mineral levels in your body (nutrient deficiencies).  Do not use any products that contain nicotine or tobacco, such as cigarettes and e-cigarettes. Your baby may be harmed by chemicals from cigarettes that pass into breast milk and exposure to secondhand smoke. If you need help quitting, ask your health care provider.  Avoid alcohol.  Do not use illegal drugs or marijuana.  Talk with your health care provider before taking any medicines. These include over-the-counter and prescription medicines as well as vitamins and herbal supplements. Some medicines that may be harmful to your baby can pass through breast milk.  It is possible to become pregnant while breastfeeding. If birth  control is desired, ask your health care provider about options that will be safe while breastfeeding your baby. Where to find more information: [employer]: www.llli.org Contact a health care provider if:  You feel like you want to stop breastfeeding or have become frustrated with breastfeeding.  Your nipples are cracked or bleeding.  Your breasts are red, tender, or warm.  You have: ? Painful breasts or nipples. ? A swollen area on either breast. ? A fever or chills. ? Nausea or vomiting. ? Drainage other than breast milk from your nipples.  Your breasts do not become full before feedings by the fifth day after you give birth.  You feel sad and depressed.  Your baby is: ? Too sleepy to eat well. ? Having trouble sleeping. ? More than 104 week old and wetting fewer than 6 diapers in a 24-hour period. ? Not gaining weight by 14 days of age.  Your baby has fewer than 3 stools in a 24-hour period.  Your baby's skin or the white parts of his or her eyes become yellow. Get help right away if:  Your baby is overly tired (lethargic) and does not want to wake up and feed.  Your baby develops an unexplained fever. Summary  Breastfeeding offers many health benefits for infant and mothers.  Try to breastfeed your infant when he or she shows early signs of hunger.  Gently tickle or stroke your baby's lips with your finger or nipple to allow the baby to open his or her mouth. Bring the baby to your breast. Make sure that much of the areola is in your baby's mouth. Offer one side and burp the baby before you offer the other side.  Talk with your health care provider or lactation consultant if you have questions or you face problems as you breastfeed. This information is not intended to replace advice given to you by your health care provider. Make sure you discuss any questions you have with your health care provider. Document Revised: 01/25/2018 Document Reviewed:  12/02/2016 Elsevier Patient Education  2020 [employer]. Exercise During Pregnancy Exercise is an important part of being healthy for people of all ages. Exercise improves the function of your heart and lungs and helps you maintain strength, flexibility, and a healthy body weight.  Exercise also boosts energy levels and elevates mood. Most women should exercise regularly during pregnancy. In rare cases, women with certain medical conditions or complications may be asked to limit or avoid exercise during pregnancy. How does this affect me? Along with maintaining general strength and flexibility, exercising during pregnancy can help:  Keep strength in muscles that are used during labor and childbirth.  Decrease low back pain.  Reduce symptoms of depression.  Control weight gain during pregnancy.  Reduce the risk of needing insulin if you develop diabetes during pregnancy.  Decrease the risk of cesarean delivery.  Speed up your recovery after giving birth. How does this affect my baby? Exercise can help you have a healthy pregnancy. Exercise does not cause premature birth. It will not cause your baby to weigh less at birth. What exercises can I do? Many exercises are safe for you to do during pregnancy. Do a variety of exercises that safely increase your heart and breathing rates and help you build and maintain muscle strength. Do exercises exactly as told by your health care provider. You may do these exercises:  Walking or hiking.  Swimming.  Water aerobics.  Riding a stationary bike.  Strength training.  Modified yoga or Pilates. Tell your instructor that you are pregnant. Avoid overstretching, and avoid lying on your back for long periods of time.  Running or jogging. Only choose this type of exercise if you: ? Ran or jogged regularly before your pregnancy. ? Can run or jog and still talk in complete sentences. What exercises should I avoid? Depending on your level of  fitness and whether you exercised regularly before your pregnancy, you may be told to limit high-intensity exercise. You can tell that you are exercising at a high intensity if you are breathing much harder and faster and cannot hold a conversation while exercising. You must avoid:  Contact sports.  Activities that put you at risk for falling on or being hit in the belly, such as downhill skiing, water skiing, surfing, rock climbing, cycling, gymnastics, and horseback riding.  Scuba diving.  Skydiving.  Yoga or Pilates in a room that is heated to high temperatures.  Jogging or running, unless you ran or jogged regularly before your pregnancy. While jogging or running, you should always be able to talk in full sentences. Do not run or jog so fast that you are unable to have a conversation.  Do not exercise at more than 6,000 feet above sea level (high elevation) if you are not used to exercising at high elevation. How do I exercise in a safe way?   Avoid overheating. Do not exercise in very high temperatures.  Wear loose-fitting, breathable clothes.  Avoid dehydration. Drink enough water before, during, and after exercise to keep your urine pale yellow.  Avoid overstretching. Because of hormone changes during pregnancy, it is easy to overstretch muscles, tendons, and ligaments during pregnancy.  Start slowly and ask your health care provider to recommend the types of exercise that are safe for you.  Do not exercise to lose weight. Follow these instructions at home:  Exercise on most days or all days of the week. Try to exercise for 30 minutes a day, 5 days a week, unless your health care provider tells you not to.  If you actively exercised before your pregnancy and you are healthy, your health care provider may tell you to continue to do moderate to high-intensity exercise.  If you are just starting to exercise or did not  exercise much before your pregnancy, your health care  provider may tell you to do low to moderate-intensity exercise. Questions to ask your health care provider  Is exercise safe for me?  What are signs that I should stop exercising?  Does my health condition mean that I should not exercise during pregnancy?  When should I avoid exercising during pregnancy? Stop exercising and contact a health care provider if: You have any unusual symptoms, such as:  Mild contractions of the uterus or cramps in the abdomen.  Dizziness that does not go away when you rest. Stop exercising and get help right away if: You have any unusual symptoms, such as:  Sudden, severe pain in your low back or your belly.  Mild contractions of the uterus or cramps in the abdomen that do not improve with rest and drinking fluids.  Chest pain.  Bleeding or fluid leaking from your vagina.  Shortness of breath. These symptoms may represent a serious problem that is an emergency. Do not wait to see if the symptoms will go away. Get medical help right away. Call your local emergency services (911 in the [place] Do not drive yourself to the hospital. Summary  Most women should exercise regularly throughout pregnancy. In rare cases, women with certain medical conditions or complications may be asked to limit or avoid exercise during pregnancy.  Do not exercise to lose weight during pregnancy.  Your health care provider will tell you what level of physical activity is right for you.  Stop exercising and contact a health care provider if you have mild contractions of the uterus or cramps in the abdomen. Get help right away if these contractions or cramps do not improve with rest and drinking fluids.  Stop exercising and get help right away if you have sudden, severe pain in your low back or belly, chest pain, shortness of breath, or bleeding or leaking of fluid from your vagina. This information is not intended to replace advice given to you by your health care provider.  Make sure you discuss any questions you have with your health care provider. Document Revised: 02/21/2019 Document Reviewed: 12/05/2018 Elsevier Patient Education  2020 [employer] for Pregnant Women While you are pregnant, your body requires additional nutrition to help support your growing baby. You also have a higher need for some vitamins and minerals, such as folic acid, calcium, iron, and vitamin D. Eating a healthy, well-balanced diet is very important for your health and your baby's health. Your need for extra calories varies for the three 44-month segments of your pregnancy (trimesters). For most women, it is recommended to consume:  150 extra calories a day during the first trimester.  300 extra calories a day during the second trimester.  300 extra calories a day during the third trimester. What are tips for following this plan?   Do not try to lose weight or go on a diet during pregnancy.  Limit your overall intake of foods that have "empty calories." These are foods that have little nutritional value, such as sweets, desserts, candies, and sugar-sweetened beverages.  Eat a variety of foods (especially fruits and vegetables) to get a full range of vitamins and minerals.  Take a prenatal vitamin to help meet your additional vitamin and mineral needs during pregnancy, specifically for folic acid, iron, calcium, and vitamin D.  Remember to stay active. Ask your health care provider what types of exercise and activities are safe for you.  Practice good food safety  and cleanliness. Wash your hands before you eat and after you prepare raw meat. Wash all fruits and vegetables well before peeling or eating. Taking these actions can help to prevent food-borne illnesses that can be very dangerous to your baby, such as listeriosis. Ask your health care provider for more information about listeriosis. What does 150 extra calories look like? Healthy options that provide 150  extra calories each day could be any of the following:  6-8 oz (170-230 g) of plain low-fat yogurt with  cup of berries.  1 Masoner with 2 teaspoons (11 g) of peanut butter.  Cut-up vegetables with  cup (60 g) of hummus.  8 oz (230 mL) or 1 cup of low-fat chocolate milk.  1 stick of string cheese with 1 medium orange.  1 peanut butter and jelly sandwich that is made with one slice of whole-wheat bread and 1 tsp (5 g) of peanut butter. For 300 extra calories, you could eat two of those healthy options each day. What is a healthy amount of weight to gain? The right amount of weight gain for you is based on your BMI before you became pregnant. If your BMI:  Was less than 18 (underweight), you should gain 28-40 lb (13-18 kg).  Was 18-24.9 (normal), you should gain 25-35 lb (11-16 kg).  Was 25-29.9 (overweight), you should gain 15-25 lb (7-11 kg).  Was 30 or greater (obese), you should gain 11-20 lb (5-9 kg). What if I am having twins or multiples? Generally, if you are carrying twins or multiples:  You may need to eat 300-600 extra calories a day.  The recommended range for total weight gain is 25-54 lb (11-25 kg), depending on your BMI before pregnancy.  Talk with your health care provider to find out about nutritional needs, weight gain, and exercise that is right for you. What foods can I eat?  Fruits All fruits. Eat a variety of colors and types of fruit. Remember to wash your fruits well before peeling or eating. Vegetables All vegetables. Eat a variety of colors and types of vegetables. Remember to wash your vegetables well before peeling or eating. Grains All grains. Choose whole grains, such as whole-wheat bread, oatmeal, or brown rice. Meats and other protein foods Lean meats, including chicken, [place], fish, and lean cuts of beef, veal, or pork. If you eat fish or seafood, choose options that are higher in omega-3 fatty acids and lower in mercury, such as salmon,  herring, mussels, trout, sardines, pollock, shrimp, crab, and lobster. Tofu. Tempeh. Beans. Eggs. Peanut butter and other nut butters. Make sure that all meats, poultry, and eggs are cooked to food-safe temperatures or "well-done." Two or more servings of fish are recommended each week in order to get the most benefits from omega-3 fatty acids that are found in seafood. Choose fish that are lower in mercury. You can find more information online:  [employer] Dairy Pasteurized milk and milk alternatives (such as almond milk). Pasteurized yogurt and pasteurized cheese. Cottage cheese. Sour cream. Beverages Water. Juices that contain 100% fruit juice or vegetable juice. Caffeine-free teas and decaffeinated coffee. Drinks that contain caffeine are okay to drink, but it is better to avoid caffeine. Keep your total caffeine intake to less than 200 mg each day (which is 12 oz or 355 mL of coffee, tea, or soda) or the limit as told by your health care provider. Fats and oils Fats and oils are okay to include in moderation. Sweets and desserts Sweets and desserts  are okay to include in moderation. Seasoning and other foods All pasteurized condiments. The items listed above may not be a complete list of foods and beverages you can eat. Contact a dietitian for more information. What foods are not recommended? Fruits Unpasteurized fruit juices. Vegetables Raw (unpasteurized) vegetable juices. Meats and other protein foods Lunch meats, bologna, hot dogs, or other deli meats. (If you must eat those meats, reheat them until they are steaming hot.) Refrigerated pat, meat spreads from a meat counter, smoked seafood that is found in the refrigerated section of a store. Raw or undercooked meats, poultry, and eggs. Raw fish, such as sushi or sashimi. Fish that have high mercury content, such as tilefish, shark, swordfish, and king mackerel. To learn more about mercury in fish, talk with your health care provider  or look for online resources, such as:  PumpkinSearch.com.ee Dairy Raw (unpasteurized) milk and any foods that have raw milk in them. Soft cheeses, such as feta, queso blanco, queso fresco, Brie, Camembert cheeses, blue-veined cheeses, and Panela cheese (unless it is made with pasteurized milk, which must be stated on the label). Beverages Alcohol. Sugar-sweetened beverages, such as sodas, teas, or energy drinks. Seasoning and other foods Homemade fermented foods and drinks, such as pickles, sauerkraut, or kombucha drinks. (Store-bought pasteurized versions of these are okay.) Salads that are made in a store or deli, such as ham salad, chicken salad, egg salad, tuna salad, and seafood salad. The items listed above may not be a complete list of foods and beverages you should avoid. Contact a dietitian for more information. Where to find more information To calculate the number of calories you need based on your height, weight, and activity level, you can use an online calculator such as:  PackageNews.is To calculate how much weight you should gain during pregnancy, you can use an online pregnancy weight gain calculator such as:  http://jones-berg.com/ Summary  While you are pregnant, your body requires additional nutrition to help support your growing baby.  Eat a variety of foods, especially fruits and vegetables to get a full range of vitamins and minerals.  Practice good food safety and cleanliness. Wash your hands before you eat and after you prepare raw meat. Wash all fruits and vegetables well before peeling or eating. Taking these actions can help to prevent food-borne illnesses, such as listeriosis, that can be very dangerous to your baby.  Do not eat raw meat or fish. Do not eat fish that have high mercury content, such as tilefish, shark, swordfish, and king mackerel. Do not eat unpasteurized (raw) dairy.  Take a prenatal vitamin to help  meet your additional vitamin and mineral needs during pregnancy, specifically for folic acid, iron, calcium, and vitamin D. This information is not intended to replace advice given to you by your health care provider. Make sure you discuss any questions you have with your health care provider. Document Revised: 03/21/2019 Document Reviewed: 07/28/2017 Elsevier Patient Education  2020 [employer]. Prenatal Care Prenatal care is health care during pregnancy. It helps you and your unborn baby (fetus) stay as healthy as possible. Prenatal care may be provided by a midwife, a family practice health care provider, or a childbirth and pregnancy specialist (obstetrician). How does this affect me? During pregnancy, you will be closely monitored for any new conditions that might develop. To lower your risk of pregnancy complications, you and your health care provider will talk about any underlying conditions you have. How does this affect my baby? Early and  consistent prenatal care increases the chance that your baby will be healthy during pregnancy. Prenatal care lowers the risk that your baby will be:  Born early (prematurely).  Smaller than expected at birth (small for gestational age). What can I expect at the first prenatal care visit? Your first prenatal care visit will likely be the longest. You should schedule your first prenatal care visit as soon as you know that you are pregnant. Your first visit is a good time to talk about any questions or concerns you have about pregnancy. At your visit, you and your health care provider will talk about:  Your medical history, including: ? Any past pregnancies. ? Your family's medical history. ? The baby's father's medical history. ? Any long-term (chronic) health conditions you have and how you manage them. ? Any surgeries or procedures you have had. ? Any current over-the-counter or prescription medicines, herbs, or supplements you are taking.  Other  factors that could pose a risk to your baby, including:  Your home setting and your stress levels, including: ? Exposure to abuse or violence. ? Household financial strain. ? Mental health conditions you have.  Your daily health habits, including diet and exercise. Your health care provider will also:  Measure your weight, height, and blood pressure.  Do a physical exam, including a pelvic and breast exam.  Perform blood tests and urine tests to check for: ? Urinary tract infection. ? Sexually transmitted infections (STIs). ? Low iron levels in your blood (anemia). ? Blood type and certain proteins on red blood cells (Rh antibodies). ? Infections and immunity to viruses, such as hepatitis B and rubella. ? HIV (human immunodeficiency virus).  Do an ultrasound to confirm your baby's growth and development and to help predict your estimated due date (EDD). This ultrasound is done with a probe that is inserted into the vagina (transvaginal ultrasound).  Discuss your options for genetic screening.  Give you information about how to keep yourself and your baby healthy, including: ? Nutrition and taking vitamins. ? Physical activity. ? How to manage pregnancy symptoms such as nausea and vomiting (morning sickness). ? Infections and substances that may be harmful to your baby and how to avoid them. ? Food safety. ? Dental care. ? Working. ? Travel. ? Warning signs to watch for and when to call your health care provider. How often will I have prenatal care visits? After your first prenatal care visit, you will have regular visits throughout your pregnancy. The visit schedule is often as follows:  Up to week 28 of pregnancy: once every 4 weeks.  28-36 weeks: once every 2 weeks.  After 36 weeks: every week until delivery. Some women may have visits more or less often depending on any underlying health conditions and the health of the baby. Keep all follow-up and prenatal care visits  as told by your health care provider. This is important. What happens during routine prenatal care visits? Your health care provider will:  Measure your weight and blood pressure.  Check for fetal heart sounds.  Measure the height of your uterus in your abdomen (fundal height). This may be measured starting around week 20 of pregnancy.  Check the position of your baby inside your uterus.  Ask questions about your diet, sleeping patterns, and whether you can feel the baby move.  Review warning signs to watch for and signs of labor.  Ask about any pregnancy symptoms you are having and how you are dealing with them. Symptoms may  include: ? Headaches. ? Nausea and vomiting. ? Vaginal discharge. ? Swelling. ? Fatigue. ? Constipation. ? Any discomfort, including back or pelvic pain. Make a list of questions to ask your health care provider at your routine visits. What tests might I have during prenatal care visits? You may have blood, urine, and imaging tests throughout your pregnancy, such as:  Urine tests to check for glucose, protein, or signs of infection.  Glucose tests to check for a form of diabetes that can develop during pregnancy (gestational diabetes mellitus). This is usually done around week 24 of pregnancy.  An ultrasound to check your baby's growth and development and to check for birth defects. This is usually done around week 20 of pregnancy.  A test to check for group B strep (GBS) infection. This is usually done around week 36 of pregnancy.  Genetic testing. This may include blood or imaging tests, such as an ultrasound. Some genetic tests are done during the first trimester and some are done during the second trimester. What else can I expect during prenatal care visits? Your health care provider may recommend getting certain vaccines during pregnancy. These may include:  A yearly flu shot (annual influenza vaccine). This is especially important if you will be  pregnant during flu season.  Tdap (tetanus, diphtheria, pertussis) vaccine. Getting this vaccine during pregnancy can protect your baby from whooping cough (pertussis) after birth. This vaccine may be recommended between weeks 27 and 36 of pregnancy. Later in your pregnancy, your health care provider may give you information about:  Childbirth and breastfeeding classes.  Choosing a health care provider for your baby.  Umbilical cord banking.  Breastfeeding.  Birth control after your baby is born.  The hospital labor and delivery unit and how to tour it.  Registering at the hospital before you go into labor. Where to find more information  Office on Women's Health: TravelLesson.ca  American Pregnancy Association: americanpregnancy.org  March of Dimes: marchofdimes.org Summary  Prenatal care helps you and your baby stay as healthy as possible during pregnancy.  Your first prenatal care visit will most likely be the longest.  You will have visits and tests throughout your pregnancy to monitor your health and your baby's health.  Bring a list of questions to your visits to ask your health care provider.  Make sure to keep all follow-up and prenatal care visits with your health care provider. This information is not intended to replace advice given to you by your health care provider. Make sure you discuss any questions you have with your health care provider. Document Revised: 02/20/2019 Document Reviewed: 10/30/2017 Elsevier Patient Education  2020 [employer].    COVID-19 and Your Pregnancy FAQ  How can I prevent infection with COVID-19 during my pregnancy? Social distancing is key. Please limit any interactions in public. Try and work from home if possible. Frequently wash your hands after touching possibly contaminated surfaces. Avoid touching your face.  Minimize trips to the store. Consider online ordering when possible.   Should I wear a mask? YES. It is  recommended by the CDC that all people wear a cloth mask or facial covering in public. You should wear a mask to your visits in the office. This will help reduce transmission as well as your risk or acquiring COVID-19. New studies are showing that even asymptomatic individuals can spread the virus from talking.   Where can I get a mask? [place] and the city of [place] are partnering to provide  masks to community members. You can pick up a mask from several locations. This website also has instructions about how to make a mask by sewing or without sewing by using a t-shirt or bandana.  https://www.[place].gov/i-want-to/learn-about/covid-19-information-and-updates/covid-19-face-mask-project  Studies have shown that if you were a tube or nylon stocking from pantyhose over a cloth mask it makes the cloth mask almost as effective as a N95 mask.  AntiquesInvestors.de  What are the symptoms of COVID-19? Fever (greater than 100.4 F), dry cough, shortness of breath.  Am I more at risk for COVID-19 since I am pregnant? There is not currently data showing that pregnant women are more adversely impacted by COVID-19 than the general population. However, we know that pregnant women tend to have worse respiratory complications from similar diseases such as the flu and SARS and for this reason should be considered an at-risk population.  What do I do if I am experiencing the symptoms of COVID-19? Testing is being limited because of test availability. If you are experiencing symptoms you should quarantine yourself, and the members of your family, for at least 2 weeks at home.   Please visit this website for more information: DiscoHelp.si.html  When should I go to the Emergency Room? Please go to the emergency room if  you are experiencing ANY of these symptoms*:  1.    Difficulty breathing or shortness of breath 2.    Persistent pain or pressure in the chest 3.    Confusion or difficulty being aroused (or awakened) 4.    Bluish lips or face  *This list is not all inclusive. Please consult our office for any other symptoms that are severe or concerning.  What do I do if I am having difficulty breathing? You should go to the Emergency Room for evaluation. At this time they have a tent set up for evaluating patients with COVID-19 symptoms.   How will my prenatal care be different because of the COVID-19 pandemic? It has been recommended to reduce the frequency of face-to-face visits and use resources such as telephone and virtual visits when possible. Using a scale, blood pressure machine and fetal doppler at home can further help reduce face-to-face visits. You will be provided with additional information on this topic.  We ask that you come to your visits alone to minimize potential exposures to  COVID-19.  How can I receive childbirth education? At this time in-person classes have been cancelled. You can register for online childbirth education, breastfeeding, and newborn care classes.  Please visit:  BikerFestival.is for more information  How will my hospital birth experience be different? The hospital is currently limiting visitors. This means that while you are in labor you can only have one person at the hospital with you. Additional family members will not be allowed to wait in the building or outside your room. Your one support person can be the father of the baby, a relative, a doula, or a friend. Once one support person is designated that person will wear a band. This band cannot be shared with multiple people.  Nitrous Gas is not being offered for pain relief since the tubing and filter for the machine can not be sanitized in a way to guarantee prevention of transmission of  COVID-19.  Nasal cannula use of oxygen for fetal indications has also been discontinued.  Currently a clear plastic sheet is being hung between mom and the delivering provider during pushing and delivery to help prevent transmission of COVID-19.  How long will I stay in the hospital for after giving birth? It is also recommended that discharge home be expedited during the COVID-19 outbreak. This means staying for 1 day after a vaginal delivery and 2 days after a cesarean section. Patients who need to stay longer for medical reasons are allowed to do so, but the goal will be for expedited discharge home.   What if I have COVID-19 and I am in labor? We ask that you wear a mask while on labor and delivery. We will try and accommodate you being placed in a room that is capable of filtering the air. Please call ahead if you are in labor and on your way to the hospital. The phone number for labor and delivery at [facility] is [phone].  If I have COVID-19 when my baby is born how can I prevent my baby from contracting COVID-19? This is an issue that will have to be discussed on a case-by-case basis. Current recommendations suggest providing separate isolation rooms for both the mother and new infant as well as limiting visitors. However, there are practical challenges to this recommendation. The situation will assuredly change and decisions will be influenced by the desires of the mother and availability of space.  Some suggestions are the use of a curtain or physical barrier between mom and infant, hand hygiene, mom wearing a mask, or 6 feet of spacing between a mom and infant.   Can I breastfeed during the COVID-19 pandemic?   Yes, breastfeeding is encouraged.  Can I breastfeed if I have COVID-19? Yes. Covid-19 has not been found in breast milk. This means you cannot give COVID-19 to your child through breast milk. Breast feeding will also help pass antibodies to fight  infection to your baby.   What precautions should I take when breastfeeding if I have COVID-19? If a mother and newborn do room-in and the mother wishes to feed at the breast, she should put on a facemask and practice hand hygiene before each feeding.  What precautions should I take when pumping if I have COVID-19? Prior to expressing breast milk, mothers should practice hand hygiene. After each pumping session, all parts that come into contact with breast milk should be thoroughly washed and the entire pump should be appropriately disinfected per the manufacturer's instructions. This expressed breast milk should be fed to the newborn by a healthy caregiver.  What if I am pregnant and work in healthcare? Based on limited data regarding COVID-19 and pregnancy, ACOG currently does not propose creating additional restrictions on pregnant health care personnel because of COVID-19 alone. Pregnant women do not appear to be at higher risk of severe disease related to COVID-19. Pregnant health care personnel should follow CDC risk assessment and infection control guidelines for health care personnel exposed to patients with suspected or confirmed COVID-19. Adherence to recommended infection prevention and control practices is an important part of protecting all health care personnel in health care settings.    Information on COVID-19 in pregnancy is very limited; however, facilities may want to consider limiting exposure of pregnant health care personnel to patients with confirmed or suspected COVID-19 infection, especially during higher-risk procedures (eg, aerosol-generating procedures), if feasible, based on staffing availability.

## 2020-02-18 NOTE — Progress Notes (Signed)
NOB today. LMP: 12/31/2019

## 2020-02-18 NOTE — Progress Notes (Signed)
New Obstetric Patient H&P    Chief Complaint: "Desires prenatal care"   History of Present Illness: Patient is a 29 y.o. G1P0000 Not Hispanic or Latino female, presents with amenorrhea and positive home pregnancy test. Patient's last menstrual period was 12/31/2019 (exact date). and based on her  LMP, her EDD is Estimated Date of Delivery: 10/06/20 and her EGA is [redacted]w[redacted]d. Cycles are 5. days, regular, and occur approximately every : 28 days. Her last pap smear was 5 years ago and was no abnormalities.    She had a urine pregnancy test which was positive 3 week(s)  ago. Her last menstrual period was normal and lasted for  3 or 4 day(s). Since her LMP she claims she has experienced breast tenderness, fatigue, queasiness. She denies vaginal bleeding. Her past medical history is noncontributory. This is her first pregnancy.  Since her LMP, she admits to the use of tobacco products  no She claims she has gained   7 pounds since the start of her pregnancy.  There are cats in the home in the home  yes If yes Indoor She admits close contact with children on a regular basis  no  She has had chicken pox in the past unknown She has had Tuberculosis exposures, symptoms, or previously tested positive for TB   no Current or past history of domestic violence. no  Genetic Screening/Teratology Counseling: (Includes patient, baby's father, or anyone in either family with:)   1. Patient's age >/= 55 at [facility]  no 2. Thalassemia ([place], [place], Mediterranean, or Asian background): MCV<80  no 3. Neural tube defect (meningomyelocele, spina bifida, anencephaly)  no 4. Congenital heart defect  no  5. Down syndrome  no 6. Tay-Sachs (Jewish, [place]  no 7. Canavan's Disease  no 8. Sickle cell disease or trait (African)  no  9. Hemophilia or other blood disorders  no  10. Muscular dystrophy  no  11. Cystic fibrosis  no  12. Huntington's Chorea  no  13. Mental retardation/autism  no 14. Other inherited  genetic or chromosomal disorder  no 15. Maternal metabolic disorder (DM, PKU, etc)  no 16. Patient or FOB with a child with a birth defect not listed above no  16a. Patient or FOB with a birth defect themselves no 17. Recurrent pregnancy loss, or stillbirth  no  18. Any medications since LMP other than prenatal vitamins (include vitamins, supplements, OTC meds, drugs, alcohol)  no 19. Any other genetic/environmental exposure to discuss  no  Infection History:   1. Lives with someone with TB or TB exposed  no  2. Patient or partner has history of genital herpes  no 3. Rash or viral illness since LMP  no 4. History of STI (GC, CT, HPV, syphilis, HIV)  no 5. History of recent travel :  no  Other pertinent information:  no     Review of Systems:10 point review of systems negative unless otherwise noted in HPI  Past Medical History:  Patient Active Problem List   Diagnosis Date Noted  . Supervision of normal first pregnancy, antepartum 02/18/2020    Clinic [facility] Prenatal Labs  Dating  Blood type:     Genetic Screen 1 Screen:    AFP:     Quad:     NIPS: Antibody:   Anatomic [place]  Rubella:   Varicella: @VZVIGG @  GTT 28 wk:  RPR:     Rhogam  HBsAg:     Vaccines TDAP:  Flu Shot: HIV:     Baby Food Breast                               GBS:   Contraception  Pap: 2016 negative, 02/18/2020:  CBB     CS/VBAC NA   Support Person Judie Grieve       . Ovarian cyst 11/10/2015    On OCP Motrin PRN pain     Past Surgical History:  Past Surgical History:  Procedure Laterality Date  . NO PAST SURGERIES      Gynecologic History: Patient's last menstrual period was 12/31/2019 (exact date).  Obstetric History: G1P0000  Family History:  Family History  Problem Relation Age of Onset  . Pancreatitis Father   . Heart disease Paternal Grandfather   . Hypertension Paternal Grandfather   . Stroke Paternal Grandfather   . Cancer Paternal Grandmother        pancreas vs  liver-not sure  . CAD Paternal Uncle     Social History:  Social History   Socioeconomic History  . Marital status: Single    Spouse name: Not on file  . Number of children: Not on file  . Years of education: Not on file  . Highest education level: Not on file  Occupational History  . Occupation: Technical brewer: [employer]  Tobacco Use  . Smoking status: Never Smoker  . Smokeless tobacco: Never Used  Substance and Sexual Activity  . Alcohol use: Not Currently    Alcohol/week: 0.0 standard drinks  . Drug use: No  . Sexual activity: Yes    Partners: Male    Birth control/protection: None  Other Topics Concern  . Not on file  Social History Narrative  . Not on file   Social Determinants of Health   Financial Resource Strain:   . Difficulty of Paying Living Expenses:   Food Insecurity:   . Worried About Programme researcher, broadcasting/film/video in the Last Year:   . Barista in the Last Year:   Transportation Needs:   . Freight forwarder (Medical):   Marland Kitchen Lack of Transportation (Non-Medical):   Physical Activity:   . Days of Exercise per Week:   . Minutes of Exercise per Session:   Stress:   . Feeling of Stress :   Social Connections:   . Frequency of Communication with Friends and Family:   . Frequency of Social Gatherings with Friends and Family:   . Attends Religious Services:   . Active Member of Clubs or Organizations:   . Attends Banker Meetings:   Marland Kitchen Marital Status:   Intimate Partner Violence:   . Fear of Current or Ex-Partner:   . Emotionally Abused:   Marland Kitchen Physically Abused:   . Sexually Abused:     Allergies:  No Known Allergies  Medications: Prior to Admission medications   Medication Sig Start Date End Date Taking? Authorizing Provider  Prenatal Vit-Fe Fumarate-FA (PRENATAL VITAMIN PO) Take by mouth.   Yes [provider]    Physical Exam Vitals: Blood pressure 118/74, weight 173 lb (78.5 kg), last menstrual period  12/31/2019.  General: NAD HEENT: normocephalic, anicteric Thyroid: no enlargement, no palpable nodules Pulmonary: No increased work of breathing, CTAB Cardiovascular: RRR, distal pulses 2+ Abdomen: NABS, soft, non-tender, non-distended.  Umbilicus without lesions.  No hepatomegaly, splenomegaly or masses palpable. No evidence of hernia  Genitourinary:  External: Normal external female  genitalia.  Normal urethral meatus, normal  Bartholin's and Skene's glands.    Vagina: Normal vaginal mucosa, no evidence of prolapse.    Cervix: Grossly normal in appearance, no bleeding, no CMT  Uterus:  Non-enlarged, mobile, normal contour.    Adnexa: ovaries non-enlarged, no adnexal masses  Rectal: deferred Extremities: no edema, erythema, or tenderness Neurologic: Grossly intact Psychiatric: mood appropriate, affect full   The following were addressed during this visit:  Breastfeeding Education - Early initiation of breastfeeding    Comments: Keeps milk supply adequate, helps contract uterus and slow bleeding, and early milk is the perfect first food and is easy to digest.   - The importance of exclusive breastfeeding    Comments: Provides antibodies, Lower risk of breast and ovarian cancers, and type-2 diabetes,Helps your body recover, Reduced chance of SIDS.   - Risks of giving your baby anything other than breast milk if you are breastfeeding    Comments: Make the baby less content with breastfeeds, may make my baby more susceptible to illness, and may reduce my milk supply.   - The importance of early skin-to-skin contact    Comments: Keeps baby warm and secure, helps keep baby's blood sugar up and breathing steady, easier to bond and breastfeed, and helps calm baby.  - Rooming-in on a 24-hour basis    Comments: Easier to learn baby's feeding cues, easier to bond and get to know each other, and encourages milk production.   - Feeding on demand or baby-led feeding    Comments: Helps  prevent breastfeeding complications, helps bring in good milk supply, prevents under or overfeeding, and helps baby feel content and satisfied   - Frequent feeding to help assure optimal milk production    Comments: Making a full supply of milk requires frequent removal of milk from breasts, infant will eat 8-12 times in 24 hours, if separated from infant use breast massage, hand expression and/ or pumping to remove milk from breasts.   - Effective positioning and attachment    Comments: Helps my baby to get enough breast milk, helps to produce an adequate milk supply, and helps prevent nipple pain and damage   - Exclusive breastfeeding for the first 6 months    Comments: Builds a healthy milk supply and keeps it up, protects baby from sickness and disease, and breastmilk has everything your baby needs for the first 6 months.    Assessment: 29 y.o. G1P0000 at [redacted]w[redacted]d by LMP presenting to initiate prenatal care  Plan: 1) Avoid alcoholic beverages. 2) Patient encouraged not to smoke.  3) Discontinue the use of all non-medicinal drugs and chemicals.  4) Take prenatal vitamins daily.  5) Nutrition, food safety (fish, cheese advisories, and high nitrite foods) and exercise discussed. 6) Hospital and practice style discussed with cross coverage system.  7) Genetic Screening, such as with 1st Trimester Screening, cell free fetal DNA, AFP testing, and Ultrasound, as well as with amniocentesis and CVS as appropriate, is discussed with patient. At the conclusion of today's visit patient requested 1st trimester screen genetic testing 8) Patient is asked about travel to areas at risk for the Zika virus, and counseled to avoid travel and exposure to mosquitoes or sexual partners who may have themselves been exposed to the virus. Testing is discussed, and will be ordered as appropriate.  9) PAPtima, urine culture, NOB panel today 10) Return to clinic in 1 week for dating scan and ROB 11) 1st trimester  screen at 12 weeks  Rod Can, Deputy Group 02/18/2020, 2:01 PM

## 2020-02-19 LAB — RPR+RH+ABO+RUB AB+AB SCR+CB...
Antibody Screen: NEGATIVE
HIV Screen 4th Generation wRfx: NONREACTIVE
Hematocrit: 36.3 % (ref 34.0–46.6)
Hemoglobin: 12.3 g/dL (ref 11.1–15.9)
Hepatitis B Surface Ag: NEGATIVE
MCH: 31.9 pg (ref 26.6–33.0)
MCHC: 33.9 g/dL (ref 31.5–35.7)
MCV: 94 fL (ref 79–97)
Platelets: 342 10*3/uL (ref 150–450)
RBC: 3.86 x10E6/uL (ref 3.77–5.28)
RDW: 11.7 % (ref 11.7–15.4)
RPR Ser Ql: NONREACTIVE
Rh Factor: POSITIVE
Rubella Antibodies, IGG: <0.9 index — ABNORMAL LOW (ref >0.99)
Varicella zoster IgG: 840 index (ref >165)
WBC: 6.3 10*3/uL (ref 3.4–10.8)

## 2020-02-20 LAB — URINE CULTURE: Organism ID, Bacteria: NO GROWTH

## 2020-02-21 LAB — CYTOLOGY - PAP
Chlamydia: NEGATIVE
Comment: NEGATIVE
Comment: NEGATIVE
Comment: NORMAL
Diagnosis: NEGATIVE
Neisseria Gonorrhea: NEGATIVE
Trichomonas: NEGATIVE

## 2020-02-28 ENCOUNTER — Ambulatory Visit (INDEPENDENT_AMBULATORY_CARE_PROVIDER_SITE_OTHER): Payer: BC Managed Care – PPO

## 2020-02-28 ENCOUNTER — Other Ambulatory Visit: Payer: Self-pay

## 2020-02-28 ENCOUNTER — Encounter: Payer: Self-pay | Admitting: Advanced Practice Midwife

## 2020-02-28 ENCOUNTER — Ambulatory Visit (INDEPENDENT_AMBULATORY_CARE_PROVIDER_SITE_OTHER): Payer: BC Managed Care – PPO | Admitting: Advanced Practice Midwife

## 2020-02-28 ENCOUNTER — Other Ambulatory Visit: Payer: Self-pay | Admitting: Advanced Practice Midwife

## 2020-02-28 VITALS — BP 106/60 | Wt 171.0 lb

## 2020-02-28 DIAGNOSIS — Z3401 Encounter for supervision of normal first pregnancy, first trimester: Secondary | ICD-10-CM | POA: Diagnosis not present

## 2020-02-28 DIAGNOSIS — Z3400 Encounter for supervision of normal first pregnancy, unspecified trimester: Secondary | ICD-10-CM

## 2020-02-28 DIAGNOSIS — Z3A08 8 weeks gestation of pregnancy: Secondary | ICD-10-CM

## 2020-02-28 NOTE — Progress Notes (Signed)
  Routine Prenatal Care Visit  Subjective  Lindsay Ellison is a 29 y.o. G1P0000 at [redacted]w[redacted]d being seen today for ongoing prenatal care.  She is currently monitored for the following issues for this low-risk pregnancy and has Ovarian cyst and Supervision of normal first pregnancy, antepartum on their problem list.  ----------------------------------------------------------------------------------- Patient reports no complaints.  She reports feeling well and has had only one incident of mild nausea. We discussed dating scan results and 1st trimester screen in 4 weeks  . Vag. Bleeding: None.   . Leaking Fluid denies.  ----------------------------------------------------------------------------------- The following portions of the patient's history were reviewed and updated as appropriate: allergies, current medications, past family history, past medical history, past social history, past surgical history and problem list. Problem list updated.  Objective  Blood pressure 106/60, weight 171 lb (77.6 kg), last menstrual period 12/31/2019. Pregravid weight 166 lb (75.3 kg) Total Weight Gain 5 lb (2.268 kg) Urinalysis: Urine Protein    Urine Glucose    Fetal Status: Fetal Heart Rate (bpm): 185          Dating: 8 weeks 4 days by scan today. No adjustment of EDD for 1 day difference.  General:  Alert, oriented and cooperative. Patient is in no acute distress.  Skin: Skin is warm and dry. No rash noted.   Cardiovascular: Normal heart rate noted  Respiratory: Normal respiratory effort, no problems with respiration noted  Abdomen: Soft, gravid, appropriate for gestational age.       Pelvic:  Cervical exam deferred        Extremities: Normal range of motion.     Mental Status: Normal mood and affect. Normal behavior. Normal judgment and thought content.   Assessment   29 y.o. G1P0000 at [redacted]w[redacted]d by  10/06/2020, by Last Menstrual Period presenting for routine prenatal visit  Plan    Preterm labor  symptoms and general obstetric precautions including but not limited to vaginal bleeding, contractions, leaking of fluid and fetal movement were reviewed in detail with the patient.   Return in about 4 weeks (around 03/27/2020) for 1st tri screen (NT scan) and rob.  Tresea Mall, CNM 02/28/2020 1:49 PM

## 2020-02-28 NOTE — Progress Notes (Signed)
ROB/Dating scan- no concerns 

## 2020-03-27 ENCOUNTER — Other Ambulatory Visit: Payer: BC Managed Care – PPO

## 2020-03-27 ENCOUNTER — Encounter: Payer: BC Managed Care – PPO | Admitting: Obstetrics and Gynecology

## 2021-01-26 ENCOUNTER — Ambulatory Visit
Admission: RE | Admit: 2021-01-26 | Discharge: 2021-01-26 | Disposition: A | Discharge status: Home / Self Care | Payer: BC Managed Care – PPO | Source: Ambulatory Visit | Attending: Emergency Medicine | Admitting: Emergency Medicine

## 2021-01-26 ENCOUNTER — Other Ambulatory Visit: Payer: Self-pay

## 2021-01-26 VITALS — BP 115/84 | HR 68 | Temp 98.2°F | Resp 18 | Ht 65.0 in | Wt 165.0 lb

## 2021-01-26 DIAGNOSIS — J302 Other seasonal allergic rhinitis: Secondary | ICD-10-CM | POA: Diagnosis not present

## 2021-01-26 DIAGNOSIS — H6981 Other specified disorders of Eustachian tube, right ear: Secondary | ICD-10-CM | POA: Diagnosis not present

## 2021-01-26 MED ORDER — PREDNISONE 50 MG PO TABS: 50.0000 mg | ORAL_TABLET | Freq: Every day | ORAL | 0 refills | Status: AC

## 2021-01-26 MED ORDER — MECLIZINE HCL 25 MG PO TABS: 25.0000 mg | ORAL_TABLET | Freq: Three times a day (TID) | ORAL | 0 refills | Status: AC | PRN

## 2021-01-26 MED ORDER — FLUTICASONE PROPIONATE 50 MCG/ACT NA SUSP: 1.0000 | Freq: Two times a day (BID) | NASAL | 0 refills | Status: AC

## 2021-01-26 NOTE — ED Provider Notes (Signed)
[facility] Provider Note  ____________________________________________  Time seen: Approximately 11:16 AM  I have reviewed the triage vital signs and the nursing notes.   HISTORY  Chief Complaint Appointment and Ear Pain    HPI Lindsay Ellison is a 30 y.o. female who presents emergency department complaining of some mild nasal congestion, right ear pressure and some mild vertigo-like symptoms.  Patient has a history of seasonal allergies, states that she is breast-feeding so she has not been using any of her normal antihistamines.  She is noted some increased sneezing, nasal congestion but no sinus pressure.  No fevers.  She has had some fullness in the right ear and some intermittent vertigo-like symptoms.  No headaches, visual changes, coughing, shortness of breath.  Again patient is not using medications as she is breast-feeding is unsure what she can take.  Patient denies any other complaints currently         Past Medical History:  Diagnosis Date  . Anxiety   . Breast cyst    right, diffuse fibrocystic (history)   . Heavy periods   . Painful menstrual periods   . Pelvic pain in female     Patient Active Problem List   Diagnosis Date Noted  . Supervision of normal first pregnancy, antepartum 02/18/2020  . Ovarian cyst 11/10/2015    Past Surgical History:  Procedure Laterality Date  . NO PAST SURGERIES      Prior to Admission medications   Medication Sig Start Date End Date Taking? Authorizing Provider  fluticasone (FLONASE) 50 MCG/ACT nasal spray Place 1 spray into both nostrils 2 (two) times daily. 01/26/21  Yes Maryuri Warnke, Delorise Royals, PA-C  meclizine (ANTIVERT) 25 MG tablet Take 1 tablet (25 mg total) by mouth 3 (three) times daily as needed for dizziness. 01/26/21  Yes Luan Maberry, Delorise Royals, PA-C  predniSONE (DELTASONE) 50 MG tablet Take 1 tablet (50 mg total) by mouth daily with breakfast. 01/26/21  Yes Chantel Teti, Delorise Royals, PA-C  Prenatal Vit-Fe  Fumarate-FA (PRENATAL VITAMIN PO) Take by mouth.   Yes [provider]    Allergies Patient has no known allergies.  Family History  Problem Relation Age of Onset  . Pancreatitis Father   . Heart disease Paternal Grandfather   . Hypertension Paternal Grandfather   . Stroke Paternal Grandfather   . Cancer Paternal Grandmother        pancreas vs liver-not sure  . CAD Paternal Uncle     Social History Social History   Tobacco Use  . Smoking status: Never Smoker  . Smokeless tobacco: Never Used  Vaping Use  . Vaping Use: Never used  Substance Use Topics  . Alcohol use: Not Currently    Alcohol/week: 0.0 standard drinks  . Drug use: No     Review of Systems  Constitutional: No fever/chills Eyes: No visual changes. No discharge ENT: Increased allergic rhinitis, right ear pressure Cardiovascular: no chest pain. Respiratory: no cough. No SOB. Gastrointestinal: No abdominal pain.  No nausea, no vomiting.  No diarrhea.  No constipation. Musculoskeletal: Negative for musculoskeletal pain. Skin: Negative for rash, abrasions, lacerations, ecchymosis. Neurological: Positive for vertigo.  Negative for headaches, focal weakness or numbness.  10 System ROS otherwise negative.  ____________________________________________   PHYSICAL EXAM:  VITAL SIGNS: ED Triage Vitals  Enc Vitals Group     BP 01/26/21 1112 115/84     Pulse Rate 01/26/21 1112 68     Resp 01/26/21 1112 18     Temp 01/26/21 1112  98.2 F (36.8 C)     Temp Source 01/26/21 1112 Oral     SpO2 01/26/21 1112 100 %     Weight 01/26/21 1111 165 lb (74.8 kg)     Height 01/26/21 1111 5\' 5"  (1.651 m)     Head Circumference --      Peak Flow --      Pain Score 01/26/21 1111 0     Pain Loc --      Pain Edu? --      Excl. in GC? --      Constitutional: Alert and oriented. Well appearing and in no acute distress. Eyes: Conjunctivae are normal. PERRL. EOMI. Head: Atraumatic. ENT:      Ears: EACs are  unremarkable bilaterally.  TM is minimally bulging right side with no injection.  No bulging of the left TM.        Nose: Trace clear congestion/rhinnorhea.No tenderness to percussion over the sinuses.  Turbinates are boggy consistent with allergic rhinitis.      Mouth/Throat: Mucous membranes are moist.  Oropharynx is nonerythematous and nonedematous.  Uvula is midline.   Neck: No stridor.  Neck supple full range of motion Hematological/Lymphatic/Immunilogical: No cervical lymphadenopathy. Cardiovascular: Normal rate, regular rhythm. Normal S1 and S2.  Good peripheral circulation. Respiratory: Normal respiratory effort without tachypnea or retractions. Lungs CTAB. Good air entry to the bases with no decreased or absent breath sounds. Musculoskeletal: Full range of motion to all extremities. No gross deformities appreciated. Neurologic:  Normal speech and language. No gross focal neurologic deficits are appreciated.  Cranial nerves II through XII grossly intact. Skin:  Skin is warm, dry and intact. No rash noted. Psychiatric: Mood and affect are normal. Speech and behavior are normal. Patient exhibits appropriate insight and judgement.   ____________________________________________   LABS (all labs ordered are listed, but only abnormal results are displayed)  Labs Reviewed - No data to display ____________________________________________  EKG   ____________________________________________  RADIOLOGY   No results found.  ____________________________________________    PROCEDURES  Procedure(s) performed:    Procedures    Medications - No data to display   ____________________________________________   INITIAL IMPRESSION / ASSESSMENT AND PLAN / ED COURSE  Pertinent labs & imaging results that were available during my care of the patient were reviewed by me and considered in my medical decision making (see chart for details).  Review of the [place] CSRS was performed in  accordance of the [facility] prior to dispensing any controlled drugs.           Patient's diagnosis is consistent with allergic rhinitis, eustachian tube dysfunction, vertigo.  Patient presented to emergency department complaining of right ear pain secondary to increased allergic rhinitis symptoms.  Patient is breast-feeding and did not want to use antihistamines.  Patient had findings consistent with allergic rhinitis with eustachian tube dysfunction likely leading to mild vertigo.  Neurologically intact.  No other concerning symptoms warranting imaging or labs.  Patient will be given Flonase, if this does not improve symptoms short course of prednisone will also be added.  Patient has a prescription for meclizine to be used if vertigo symptoms become exceptionally intrusive keeping her from doing her daily activity.  If this occurs she should pump and dump to ensure no crossover.  Patient will follow primary care as needed.      ____________________________________________  FINAL CLINICAL IMPRESSION(S) / ED DIAGNOSES  Final diagnoses:  Seasonal allergic rhinitis, unspecified trigger  Eustachian tube dysfunction, right  NEW MEDICATIONS STARTED DURING THIS VISIT:  ED Discharge Orders         Ordered    fluticasone (FLONASE) 50 MCG/ACT nasal spray  2 times daily        01/26/21 1130    predniSONE (DELTASONE) 50 MG tablet  Daily with breakfast        01/26/21 1130    meclizine (ANTIVERT) 25 MG tablet  3 times daily PRN        01/26/21 1130              This chart was dictated using voice recognition software/Dragon. Despite best efforts to proofread, errors can occur which can change the meaning. Any change was purely unintentional.    Racheal Patches, PA-C 01/26/21 1133

## 2021-01-26 NOTE — ED Triage Notes (Signed)
Patient c/o right ear pain that started 1 week ago. She also reports vertigo that started around the same time.

## 2022-03-07 ENCOUNTER — Telehealth: Payer: Self-pay
